# Patient Record
Sex: Female | Born: 1990 | Hispanic: Yes | Marital: Single | Smoking: Former smoker
Health system: Southern US, Community
[De-identification: ages and names within clinical notes are randomized; demographics above are authoritative.]

## PROBLEM LIST (undated history)

## (undated) ENCOUNTER — Inpatient Hospital Stay (HOSPITAL_COMMUNITY): Payer: Self-pay

## (undated) DIAGNOSIS — A749 Chlamydial infection, unspecified: Secondary | ICD-10-CM

## (undated) DIAGNOSIS — A159 Respiratory tuberculosis unspecified: Secondary | ICD-10-CM

## (undated) DIAGNOSIS — D649 Anemia, unspecified: Secondary | ICD-10-CM

## (undated) HISTORY — PX: BREAST ENHANCEMENT SURGERY: SHX7

## (undated) HISTORY — PX: LIPOSUCTION: SHX10

## (undated) HISTORY — PX: DILATION AND CURETTAGE OF UTERUS: SHX78

---

## 2011-06-02 LAB — ABO/RH: RH Type: POSITIVE

## 2011-06-02 LAB — RUBELLA ANTIBODY, IGM: Rubella: IMMUNE

## 2011-06-02 LAB — HIV ANTIBODY (ROUTINE TESTING W REFLEX): HIV: NONREACTIVE

## 2011-06-02 LAB — HEPATITIS B SURFACE ANTIGEN: Hepatitis B Surface Ag: NEGATIVE

## 2011-06-02 LAB — ANTIBODY SCREEN: Antibody Screen: NEGATIVE

## 2011-06-02 LAB — RPR: RPR: NONREACTIVE

## 2011-06-02 LAB — GC/CHLAMYDIA PROBE AMP, GENITAL: Chlamydia: NEGATIVE

## 2011-11-07 ENCOUNTER — Encounter (HOSPITAL_COMMUNITY): Payer: Self-pay | Admitting: *Deleted

## 2011-11-07 ENCOUNTER — Inpatient Hospital Stay (HOSPITAL_COMMUNITY)
Admission: AD | Admit: 2011-11-07 | Discharge: 2011-11-07 | Disposition: A | Discharge status: Home / Self Care | Payer: Self-pay | Source: Ambulatory Visit | Attending: Obstetrics & Gynecology | Admitting: Obstetrics & Gynecology

## 2011-11-07 DIAGNOSIS — O479 False labor, unspecified: Secondary | ICD-10-CM

## 2011-11-07 DIAGNOSIS — O99891 Other specified diseases and conditions complicating pregnancy: Secondary | ICD-10-CM | POA: Insufficient documentation

## 2011-11-07 HISTORY — DX: Anemia, unspecified: D64.9

## 2011-11-07 LAB — WET PREP, GENITAL
Clue Cells Wet Prep HPF POC: NONE SEEN
Trich, Wet Prep: NONE SEEN
Yeast Wet Prep HPF POC: NONE SEEN

## 2011-11-07 LAB — POCT FERN TEST: Fern Test: NEGATIVE

## 2011-11-07 NOTE — Progress Notes (Signed)
Pt G1 at 39wks, felt some wetness around 1700 and having cramping.  Pt denies bleeding.

## 2011-11-07 NOTE — ED Provider Notes (Signed)
Alicia Wang is a 20 y.o. female presenting for possible rupture of membranes and cramping. Maternal Medical History:  Reason for admission: Reason for Admission:   nausea  OB History    Grav Para Term Preterm Abortions TAB SAB Ect Mult Living   2    1  1         Past Medical History  Diagnosis Date  . Anemia    Past Surgical History  Procedure Date  . Dilation and curettage of uterus    Family History: family history is not on file. Social History:  reports that she has quit smoking. She does not have any smokeless tobacco history on file. She reports that she does not drink alcohol or use illicit drugs.  Review of Systems  Eyes: Negative for blurred vision.  Respiratory: Negative.   Cardiovascular: Negative for chest pain and palpitations.  Gastrointestinal: Negative for nausea and vomiting.  Genitourinary: Negative for dysuria.  Musculoskeletal: Negative.   Neurological: Negative.  Negative for headaches.    Dilation: 1.5 Exam by:: Hamby,CNM Blood pressure 119/69, pulse 78, temperature 98.5 F (36.9 C), temperature source Oral, resp. rate 18, height 5\' 3"  (1.6 m), weight 76.658 kg (169 lb). Maternal Exam:  Uterine Assessment: Contraction strength is mild.  Contraction duration is 60 seconds. Contraction frequency is irregular.   Abdomen: Patient reports no abdominal tenderness. Introitus: Normal vulva. Vagina is positive for vaginal discharge (THICK WHITE ODORLESS DISCHARGE.).  Pelvis: adequate for delivery.   Cervix: Cervix evaluated by sterile speculum exam and digital exam.     Fetal Exam Fetal Monitor Review: Mode: ultrasound.   Baseline rate: 150.  Variability: moderate (6-25 bpm).   Pattern: accelerations present and no decelerations.    Fetal State Assessment: Category I - tracings are normal.     Sterile spec exam: neg pooling, neg nitrazine, neg fern. Physical Exam  Genitourinary: Vaginal discharge (THICK WHITE ODORLESS DISCHARGE.) found.      Prenatal labs: ABO, Rh:   Antibody:   Rubella:   RPR:    HBsAg:    HIV:    GBS:     Assessment/Plan: Wet prep STAT. Negative for ROM. Wet prep +WBCs with bacteria, neg clue, neg yeast, neg trich. Will send urine Gc/CT, and discharge home with labor precautions.    Anice Paganini CNM 11/07/2011, 9:32 PM

## 2011-11-20 ENCOUNTER — Inpatient Hospital Stay (HOSPITAL_COMMUNITY): Payer: Medicaid Other | Admitting: Anesthesiology

## 2011-11-20 ENCOUNTER — Encounter (HOSPITAL_COMMUNITY)
Admission: AD | Disposition: A | Discharge status: Home / Self Care | Payer: Self-pay | Source: Ambulatory Visit | Attending: Obstetrics & Gynecology

## 2011-11-20 ENCOUNTER — Encounter (HOSPITAL_COMMUNITY): Payer: Self-pay | Admitting: Obstetrics and Gynecology

## 2011-11-20 ENCOUNTER — Inpatient Hospital Stay (HOSPITAL_COMMUNITY)
Admission: AD | Admit: 2011-11-20 | Discharge: 2011-11-23 | DRG: 766 | Disposition: A | Discharge status: Home / Self Care | Payer: Medicaid Other | Source: Ambulatory Visit | Attending: Obstetrics & Gynecology | Admitting: Obstetrics & Gynecology

## 2011-11-20 ENCOUNTER — Encounter (HOSPITAL_COMMUNITY): Payer: Self-pay | Admitting: Anesthesiology

## 2011-11-20 ENCOUNTER — Other Ambulatory Visit: Payer: Self-pay | Admitting: Obstetrics & Gynecology

## 2011-11-20 DIAGNOSIS — O4700 False labor before 37 completed weeks of gestation, unspecified trimester: Secondary | ICD-10-CM | POA: Diagnosis present

## 2011-11-20 DIAGNOSIS — IMO0002 Reserved for concepts with insufficient information to code with codable children: Secondary | ICD-10-CM | POA: Diagnosis present

## 2011-11-20 DIAGNOSIS — O324XX0 Maternal care for high head at term, not applicable or unspecified: Secondary | ICD-10-CM | POA: Diagnosis present

## 2011-11-20 LAB — CBC
MCH: 31.4 pg (ref 26.0–34.0)
MCHC: 33.3 g/dL (ref 30.0–36.0)
Platelets: 267 10*3/uL (ref 150–400)
RDW: 13.5 % (ref 11.5–15.5)

## 2011-11-20 LAB — RPR: RPR Ser Ql: NONREACTIVE

## 2011-11-20 SURGERY — Surgical Case
Anesthesia: Epidural | Site: Abdomen | Wound class: Clean Contaminated

## 2011-11-20 MED ORDER — MEPERIDINE HCL 25 MG/ML IJ SOLN
INTRAMUSCULAR | Status: AC
  Filled 2011-11-20: qty 1

## 2011-11-20 MED ORDER — OXYCODONE-ACETAMINOPHEN 5-325 MG PO TABS
2.0000 | ORAL_TABLET | ORAL | Status: DC | PRN
  Filled 2011-11-20: qty 1

## 2011-11-20 MED ORDER — EPHEDRINE 5 MG/ML INJ: 10.0000 mg | INTRAVENOUS | Status: DC | PRN

## 2011-11-20 MED ORDER — EPHEDRINE 5 MG/ML INJ
10.0000 mg | INTRAVENOUS | Status: DC | PRN
  Filled 2011-11-20: qty 4

## 2011-11-20 MED ORDER — LACTATED RINGERS IV SOLN
500.0000 mL | INTRAVENOUS | Status: DC | PRN
  Administered 2011-11-20: 1000 mL via INTRAVENOUS

## 2011-11-20 MED ORDER — MORPHINE SULFATE 0.5 MG/ML IJ SOLN
INTRAMUSCULAR | Status: AC
  Filled 2011-11-20: qty 10

## 2011-11-20 MED ORDER — MORPHINE SULFATE (PF) 0.5 MG/ML IJ SOLN
INTRAMUSCULAR | Status: DC | PRN
  Administered 2011-11-20: 4 mg via EPIDURAL

## 2011-11-20 MED ORDER — OXYTOCIN BOLUS FROM INFUSION
500.0000 mL | Freq: Once | INTRAVENOUS | Status: DC
  Filled 2011-11-20: qty 500

## 2011-11-20 MED ORDER — FLEET ENEMA 7-19 GM/118ML RE ENEM: 1.0000 | ENEMA | RECTAL | Status: DC | PRN

## 2011-11-20 MED ORDER — DIPHENHYDRAMINE HCL 50 MG/ML IJ SOLN: 12.5000 mg | INTRAMUSCULAR | Status: DC | PRN

## 2011-11-20 MED ORDER — ONDANSETRON HCL 4 MG/2ML IJ SOLN
INTRAMUSCULAR | Status: AC
  Filled 2011-11-20: qty 2

## 2011-11-20 MED ORDER — FENTANYL CITRATE 0.05 MG/ML IJ SOLN
INTRAMUSCULAR | Status: DC | PRN
  Administered 2011-11-20: 100 ug via INTRAVENOUS

## 2011-11-20 MED ORDER — PHENYLEPHRINE 40 MCG/ML (10ML) SYRINGE FOR IV PUSH (FOR BLOOD PRESSURE SUPPORT)
80.0000 ug | PREFILLED_SYRINGE | INTRAVENOUS | Status: DC | PRN
  Filled 2011-11-20: qty 5

## 2011-11-20 MED ORDER — LIDOCAINE HCL (PF) 1 % IJ SOLN
30.0000 mL | INTRAMUSCULAR | Status: DC | PRN
  Filled 2011-11-20 (×2): qty 30

## 2011-11-20 MED ORDER — CITRIC ACID-SODIUM CITRATE 334-500 MG/5ML PO SOLN
30.0000 mL | ORAL | Status: DC | PRN
  Administered 2011-11-20: 30 mL via ORAL
  Filled 2011-11-20: qty 15

## 2011-11-20 MED ORDER — ACETAMINOPHEN 500 MG PO TABS
1000.0000 mg | ORAL_TABLET | Freq: Once | ORAL | Status: AC
  Administered 2011-11-20: 1000 mg via ORAL
  Filled 2011-11-20: qty 2

## 2011-11-20 MED ORDER — ONDANSETRON HCL 4 MG/2ML IJ SOLN
4.0000 mg | Freq: Four times a day (QID) | INTRAMUSCULAR | Status: DC | PRN
  Administered 2011-11-20: 4 mg via INTRAVENOUS
  Filled 2011-11-20: qty 2

## 2011-11-20 MED ORDER — ONDANSETRON HCL 4 MG/2ML IJ SOLN
INTRAMUSCULAR | Status: DC | PRN
  Administered 2011-11-20: 4 mg via INTRAVENOUS

## 2011-11-20 MED ORDER — IBUPROFEN 600 MG PO TABS: 600.0000 mg | ORAL_TABLET | Freq: Four times a day (QID) | ORAL | Status: DC | PRN

## 2011-11-20 MED ORDER — ACETAMINOPHEN 325 MG PO TABS: 650.0000 mg | ORAL_TABLET | ORAL | Status: DC | PRN

## 2011-11-20 MED ORDER — OXYTOCIN 20 UNITS IN LACTATED RINGERS INFUSION - SIMPLE: 1.0000 m[IU]/min | INTRAVENOUS | Status: DC

## 2011-11-20 MED ORDER — FENTANYL 2.5 MCG/ML BUPIVACAINE 1/10 % EPIDURAL INFUSION (WH - ANES)
14.0000 mL/h | INTRAMUSCULAR | Status: DC
  Administered 2011-11-20 (×4): 14 mL/h via EPIDURAL
  Filled 2011-11-20 (×4): qty 60

## 2011-11-20 MED ORDER — CEFAZOLIN SODIUM 1-5 GM-% IV SOLN
INTRAVENOUS | Status: DC | PRN
  Administered 2011-11-20: 2 g via INTRAVENOUS

## 2011-11-20 MED ORDER — OXYTOCIN 10 UNIT/ML IJ SOLN
INTRAMUSCULAR | Status: AC
  Filled 2011-11-20: qty 4

## 2011-11-20 MED ORDER — SODIUM BICARBONATE 8.4 % IV SOLN
INTRAVENOUS | Status: DC | PRN
  Administered 2011-11-20: 10 mL via EPIDURAL

## 2011-11-20 MED ORDER — LACTATED RINGERS IV SOLN
INTRAVENOUS | Status: DC
  Administered 2011-11-20 (×2): via INTRAVENOUS

## 2011-11-20 MED ORDER — LACTATED RINGERS IV SOLN
500.0000 mL | Freq: Once | INTRAVENOUS | Status: AC
  Administered 2011-11-20: 11:00:00 via INTRAVENOUS

## 2011-11-20 MED ORDER — LIDOCAINE-EPINEPHRINE (PF) 2 %-1:200000 IJ SOLN
INTRAMUSCULAR | Status: AC
  Filled 2011-11-20: qty 20

## 2011-11-20 MED ORDER — CEFAZOLIN SODIUM-DEXTROSE 2-3 GM-% IV SOLR: 2.0000 g | INTRAVENOUS | Status: DC

## 2011-11-20 MED ORDER — MEPERIDINE HCL 25 MG/ML IJ SOLN
INTRAMUSCULAR | Status: DC | PRN
  Administered 2011-11-20: 25 mg via INTRAVENOUS

## 2011-11-20 MED ORDER — PHENYLEPHRINE 40 MCG/ML (10ML) SYRINGE FOR IV PUSH (FOR BLOOD PRESSURE SUPPORT): 80.0000 ug | PREFILLED_SYRINGE | INTRAVENOUS | Status: DC | PRN

## 2011-11-20 MED ORDER — OXYTOCIN 20 UNITS IN LACTATED RINGERS INFUSION - SIMPLE
1.0000 m[IU]/min | INTRAVENOUS | Status: DC
  Administered 2011-11-20: 2 m[IU]/min via INTRAVENOUS
  Filled 2011-11-20: qty 1000

## 2011-11-20 MED ORDER — FENTANYL CITRATE 0.05 MG/ML IJ SOLN
INTRAMUSCULAR | Status: AC
  Filled 2011-11-20: qty 2

## 2011-11-20 MED ORDER — LIDOCAINE HCL 1.5 % IJ SOLN
INTRAMUSCULAR | Status: DC | PRN
  Administered 2011-11-20 (×2): 5 mL via INTRADERMAL
  Administered 2011-11-20: 2 mL via INTRADERMAL

## 2011-11-20 MED ORDER — MORPHINE SULFATE (PF) 0.5 MG/ML IJ SOLN
INTRAMUSCULAR | Status: DC | PRN
  Administered 2011-11-20: 1 mg via INTRAVENOUS

## 2011-11-20 MED ORDER — SODIUM BICARBONATE 8.4 % IV SOLN
INTRAVENOUS | Status: AC
  Filled 2011-11-20: qty 50

## 2011-11-20 MED ORDER — OXYTOCIN 20 UNITS IN LACTATED RINGERS INFUSION - SIMPLE: 125.0000 mL/h | Freq: Once | INTRAVENOUS | Status: DC

## 2011-11-20 MED ORDER — PHENYLEPHRINE 40 MCG/ML (10ML) SYRINGE FOR IV PUSH (FOR BLOOD PRESSURE SUPPORT)
PREFILLED_SYRINGE | INTRAVENOUS | Status: AC
  Filled 2011-11-20: qty 10

## 2011-11-20 SURGICAL SUPPLY — 40 items
BENZOIN TINCTURE PRP APPL 2/3 (GAUZE/BANDAGES/DRESSINGS) ×2 IMPLANT
CANISTER WOUND CARE 500ML ATS (WOUND CARE) IMPLANT
CHLORAPREP W/TINT 26ML (MISCELLANEOUS) ×2 IMPLANT
CLOTH BEACON ORANGE TIMEOUT ST (SAFETY) ×2 IMPLANT
CONTAINER PREFILL 10% NBF 15ML (MISCELLANEOUS) IMPLANT
DRESSING TELFA 8X3 (GAUZE/BANDAGES/DRESSINGS) ×2 IMPLANT
DRSG VAC ATS LRG SENSATRAC (GAUZE/BANDAGES/DRESSINGS) IMPLANT
DRSG VAC ATS MED SENSATRAC (GAUZE/BANDAGES/DRESSINGS) IMPLANT
DRSG VAC ATS SM SENSATRAC (GAUZE/BANDAGES/DRESSINGS) IMPLANT
ELECT REM PT RETURN 9FT ADLT (ELECTROSURGICAL) ×2
ELECTRODE REM PT RTRN 9FT ADLT (ELECTROSURGICAL) ×1 IMPLANT
EXTRACTOR VACUUM M CUP 4 TUBE (SUCTIONS) IMPLANT
GAUZE SPONGE 4X4 12PLY STRL LF (GAUZE/BANDAGES/DRESSINGS) ×2 IMPLANT
GLOVE BIO SURGEON STRL SZ 6.5 (GLOVE) ×4 IMPLANT
GOWN PREVENTION PLUS LG XLONG (DISPOSABLE) ×6 IMPLANT
KIT ABG SYR 3ML LUER SLIP (SYRINGE) ×2 IMPLANT
NEEDLE HYPO 25X5/8 SAFETYGLIDE (NEEDLE) ×2 IMPLANT
NS IRRIG 1000ML POUR BTL (IV SOLUTION) ×2 IMPLANT
PACK C SECTION WH (CUSTOM PROCEDURE TRAY) ×2 IMPLANT
PAD ABD 7.5X8 STRL (GAUZE/BANDAGES/DRESSINGS) ×2 IMPLANT
RTRCTR C-SECT PINK 25CM LRG (MISCELLANEOUS) IMPLANT
RTRCTR C-SECT PINK 34CM XLRG (MISCELLANEOUS) IMPLANT
SLEEVE SCD COMPRESS KNEE MED (MISCELLANEOUS) IMPLANT
STAPLER VISISTAT 35W (STAPLE) IMPLANT
STRIP CLOSURE SKIN 1/2X4 (GAUZE/BANDAGES/DRESSINGS) ×2 IMPLANT
SUT MNCRL 0 VIOLET CTX 36 (SUTURE) ×3 IMPLANT
SUT MNCRL AB 3-0 PS2 27 (SUTURE) ×2 IMPLANT
SUT MONOCRYL 0 CTX 36 (SUTURE) ×3
SUT PDS AB 0 CTX 36 PDP370T (SUTURE) ×2 IMPLANT
SUT PLAIN 0 NONE (SUTURE) IMPLANT
SUT VIC AB 0 CT1 27 (SUTURE) ×2
SUT VIC AB 0 CT1 27XBRD ANBCTR (SUTURE) ×2 IMPLANT
SUT VIC AB 2-0 CT1 (SUTURE) IMPLANT
SUT VIC AB 2-0 CT1 27 (SUTURE) ×1
SUT VIC AB 2-0 CT1 TAPERPNT 27 (SUTURE) ×1 IMPLANT
SUT VIC AB 2-0 SH 27 (SUTURE)
SUT VIC AB 2-0 SH 27XBRD (SUTURE) IMPLANT
TOWEL OR 17X24 6PK STRL BLUE (TOWEL DISPOSABLE) ×4 IMPLANT
TRAY FOLEY CATH 14FR (SET/KITS/TRAYS/PACK) IMPLANT
WATER STERILE IRR 1000ML POUR (IV SOLUTION) ×2 IMPLANT

## 2011-11-20 NOTE — Anesthesia Procedure Notes (Signed)

## 2011-11-20 NOTE — Progress Notes (Signed)
Hospital spanish interpretater in. Epidural procedure explained to pt.

## 2011-11-20 NOTE — Progress Notes (Signed)
Patient states she is having contractions about every 5 minutes. Feeling some movement, not as much as usual.

## 2011-11-20 NOTE — Progress Notes (Signed)
1530- 02 dc'd.

## 2011-11-20 NOTE — Anesthesia Preprocedure Evaluation (Addendum)
Anesthesia Evaluation  Patient identified by MRN, date of birth, ID band Patient awake    Reviewed: Allergy & Precautions, H&P , NPO status , Patient's Chart, lab work & pertinent test results, reviewed documented beta blocker date and time   History of Anesthesia Complications Negative for: history of anesthetic complications  Airway Mallampati: II TM Distance: >3 FB Neck ROM: full    Dental  (+) Teeth Intact   Pulmonary neg pulmonary ROS,  clear to auscultation        Cardiovascular neg cardio ROS regular Normal    Neuro/Psych Negative Neurological ROS  Negative Psych ROS   GI/Hepatic negative GI ROS, Neg liver ROS,   Endo/Other  Negative Endocrine ROS  Renal/GU negative Renal ROS     Musculoskeletal   Abdominal   Peds  Hematology negative hematology ROS (+)   Anesthesia Other Findings   Reproductive/Obstetrics (+) Pregnancy                           Anesthesia Physical Anesthesia Plan  ASA: II  Anesthesia Plan: Epidural   Post-op Pain Management:    Induction:   Airway Management Planned:   Additional Equipment:   Intra-op Plan:   Post-operative Plan:   Informed Consent: I have reviewed the patients History and Physical, chart, labs and discussed the procedure including the risks, benefits and alternatives for the proposed anesthesia with the patient or authorized representative who has indicated his/her understanding and acceptance.     Plan Discussed with:   Anesthesia Plan Comments:         Anesthesia Quick Evaluation  

## 2011-11-20 NOTE — H&P (Signed)
Demara Lover is a 20 y.o. female presenting for contractions. Maternal Medical History:  Reason for admission: Reason for admission: contractions.  Reason for Admission:   nauseaContractions: Frequency: regular.    Fetal activity: Perceived fetal activity is normal.    Prenatal complications: no prenatal complications   OB History    Grav Para Term Preterm Abortions TAB SAB Ect Mult Living   2 0 0 0 1 0 1 0 0 0      Past Medical History  Diagnosis Date  . Anemia    Past Surgical History  Procedure Date  . Dilation and curettage of uterus    Family History: family history is not on file. Social History:  reports that she has quit smoking. She does not have any smokeless tobacco history on file. She reports that she does not drink alcohol or use illicit drugs.  Review of Systems  Constitutional: Negative for fever.  Eyes: Negative for blurred vision.  Respiratory: Negative for shortness of breath.   Gastrointestinal: Negative for nausea and vomiting.  Skin: Negative for rash.  Neurological: Negative for headaches.    Dilation: 4 Effacement (%): 80 Station: -2 Exam by:: J. Rasch RN  Blood pressure 108/84, pulse 86, temperature 98.5 F (36.9 C), temperature source Oral, resp. rate 18, height 5\' 3"  (1.6 m), weight 77.202 kg (170 lb 3.2 oz), SpO2 99.00%. Maternal Exam:  Uterine Assessment: Contraction strength is firm.  Contraction frequency is regular.   Abdomen: Patient reports no abdominal tenderness. Fetal presentation: vertex  Introitus: not evaluated.     Fetal Exam Fetal Monitor Review: Variability: moderate (6-25 bpm).   Pattern: accelerations present and no decelerations.    Fetal State Assessment: Category I - tracings are normal.     Physical Exam  Constitutional: She appears well-developed.  HENT:  Head: Normocephalic.  Neck: Neck supple. No thyromegaly present.  Cardiovascular: Normal rate and regular rhythm.   Respiratory: Breath sounds  normal.  GI: Soft. Bowel sounds are normal.  Skin: No rash noted.    Prenatal labs: ABO, Rh: O/Positive/-- (06/13 0000) Antibody: Negative (06/13 0000) Rubella: Immune (06/13 0000) RPR: Nonreactive (06/13 0000)  HBsAg: Negative (06/13 0000)  HIV: Non-reactive (06/13 0000)  GBS: Negative (11/08 0000)   Assessment/Plan: Nullipara at term, active labor, Category 1 FHT. Admit, anticipate an NSVD   JACKSON-MOORE,Majestic Brister A 11/20/2011, 9:52 AM

## 2011-11-20 NOTE — Progress Notes (Signed)
Alicia Wang is a 20 y.o. G2P0010 at [redacted]w[redacted]d by LMP admitted for active labor  Subjective: Comfortable  Objective: BP 112/68  Pulse 64  Temp(Src) 98.5 F (36.9 C) (Oral)  Resp 20  Ht 5\' 3"  (1.6 m)  Wt 77.202 kg (170 lb 3.2 oz)  BMI 30.15 kg/m2  SpO2 100%      FHT: 140s, moderate variability, early decelerations UC:   irregular, every 8 minutes SVE:   Dilation: 6 Effacement (%): 100 Station: -1 Exam by:: dr. Tamela Oddi IUPC placed  Labs: Lab Results  Component Value Date   WBC 10.5 11/20/2011   HGB 13.3 11/20/2011   HCT 39.9 11/20/2011   MCV 94.3 11/20/2011   PLT 267 11/20/2011    Assessment / Plan: Protracted latent phase  Labor: Will augment with Pitocin  Fetal Wellbeing:  Category I Pain Control:  Epidural I/D:  n/a Anticipated MOD:  NSVD  JACKSON-MOORE,Pope Brunty A 11/20/2011, 2:42 PM

## 2011-11-20 NOTE — Progress Notes (Signed)
Called Dr. Tamela Oddi to report  Decels and temp. Of 100.3.  Provider states she is looking at strip and it is fine, orders received for Tylenol.

## 2011-11-20 NOTE — Progress Notes (Signed)
Pt presents to [facility] with chief complaint of contractions. Pt says contractions started this morning at 0400. Pt is a G1 at [redacted]w[redacted]d

## 2011-11-20 NOTE — Progress Notes (Signed)
Alicia Wang is a 20 y.o. G2P0010 at [redacted]w[redacted]d by LMP admitted for active labor  Subjective: Comfortable  Objective: BP 118/67  Pulse 71  Temp(Src) 98.6 F (37 C) (Oral)  Resp 20  Ht 5\' 3"  (1.6 m)  Wt 77.202 kg (170 lb 3.2 oz)  BMI 30.15 kg/m2  SpO2 99%      FHT:  FHR: 140 bpm, variability: moderate,  accelerations:  Abscent,  decelerations:  Present mild variable decelerations UC:   regular, every 2 minutes SVE:   Dilation: 10 Effacement (%): 100 Station: +1 Exam by:: B.Cagna,Rn  Labs: Lab Results  Component Value Date   WBC 10.5 11/20/2011   HGB 13.3 11/20/2011   HCT 39.9 11/20/2011   MCV 94.3 11/20/2011   PLT 267 11/20/2011    Assessment / Plan: Stage II  Labor:  Passive second stage for now   Fetal Wellbeing:  FHT c/w fetal well-being Pain Control:  Epidural I/D:  n/a Anticipated MOD:  NSVD  JACKSON-MOORE,Sophiarose Eades A 11/20/2011, 10:22 PM

## 2011-11-21 ENCOUNTER — Encounter (HOSPITAL_COMMUNITY): Payer: Self-pay | Admitting: *Deleted

## 2011-11-21 LAB — HEMOGLOBIN: Hemoglobin: 11 g/dL — ABNORMAL LOW (ref 12.0–15.0)

## 2011-11-21 MED ORDER — TETANUS-DIPHTH-ACELL PERTUSSIS 5-2.5-18.5 LF-MCG/0.5 IM SUSP: 0.5000 mL | Freq: Once | INTRAMUSCULAR | Status: DC

## 2011-11-21 MED ORDER — WITCH HAZEL-GLYCERIN EX PADS: 1.0000 "application " | MEDICATED_PAD | CUTANEOUS | Status: DC | PRN

## 2011-11-21 MED ORDER — MEPERIDINE HCL 25 MG/ML IJ SOLN: 6.2500 mg | INTRAMUSCULAR | Status: DC | PRN

## 2011-11-21 MED ORDER — KETOROLAC TROMETHAMINE 60 MG/2ML IM SOLN
60.0000 mg | Freq: Once | INTRAMUSCULAR | Status: AC | PRN
  Administered 2011-11-21: 60 mg via INTRAMUSCULAR

## 2011-11-21 MED ORDER — ONDANSETRON HCL 4 MG/2ML IJ SOLN: 4.0000 mg | Freq: Three times a day (TID) | INTRAMUSCULAR | Status: DC | PRN

## 2011-11-21 MED ORDER — IBUPROFEN 600 MG PO TABS
600.0000 mg | ORAL_TABLET | Freq: Four times a day (QID) | ORAL | Status: DC | PRN
  Filled 2011-11-21 (×5): qty 1

## 2011-11-21 MED ORDER — SODIUM CHLORIDE 0.9 % IV SOLN
1.0000 ug/kg/h | INTRAVENOUS | Status: DC | PRN
  Filled 2011-11-21: qty 2.5

## 2011-11-21 MED ORDER — IBUPROFEN 600 MG PO TABS
600.0000 mg | ORAL_TABLET | Freq: Four times a day (QID) | ORAL | Status: DC
  Administered 2011-11-21 – 2011-11-23 (×8): 600 mg via ORAL
  Filled 2011-11-21 (×3): qty 1

## 2011-11-21 MED ORDER — SODIUM CHLORIDE 0.9 % IJ SOLN: 3.0000 mL | INTRAMUSCULAR | Status: DC | PRN

## 2011-11-21 MED ORDER — METOCLOPRAMIDE HCL 5 MG/ML IJ SOLN: 10.0000 mg | Freq: Three times a day (TID) | INTRAMUSCULAR | Status: DC | PRN

## 2011-11-21 MED ORDER — FERROUS SULFATE 325 (65 FE) MG PO TABS
325.0000 mg | ORAL_TABLET | Freq: Two times a day (BID) | ORAL | Status: DC
  Administered 2011-11-21 – 2011-11-23 (×4): 325 mg via ORAL
  Filled 2011-11-21 (×4): qty 1

## 2011-11-21 MED ORDER — LACTATED RINGERS IV SOLN: INTRAVENOUS | Status: DC

## 2011-11-21 MED ORDER — NALBUPHINE SYRINGE 5 MG/0.5 ML
5.0000 mg | INJECTION | INTRAMUSCULAR | Status: DC | PRN
  Filled 2011-11-21: qty 1

## 2011-11-21 MED ORDER — MEASLES, MUMPS & RUBELLA VAC ~~LOC~~ INJ
0.5000 mL | INJECTION | Freq: Once | SUBCUTANEOUS | Status: DC
  Filled 2011-11-21: qty 0.5

## 2011-11-21 MED ORDER — PHENYLEPHRINE HCL 10 MG/ML IJ SOLN
INTRAMUSCULAR | Status: DC | PRN
  Administered 2011-11-21: 80 ug via INTRAVENOUS
  Administered 2011-11-21: 40 ug via INTRAVENOUS
  Administered 2011-11-21: 80 ug via INTRAVENOUS

## 2011-11-21 MED ORDER — ONDANSETRON HCL 4 MG/2ML IJ SOLN: 4.0000 mg | INTRAMUSCULAR | Status: DC | PRN

## 2011-11-21 MED ORDER — MAGNESIUM HYDROXIDE 400 MG/5ML PO SUSP: 30.0000 mL | ORAL | Status: DC | PRN

## 2011-11-21 MED ORDER — DIPHENHYDRAMINE HCL 25 MG PO CAPS: 25.0000 mg | ORAL_CAPSULE | Freq: Four times a day (QID) | ORAL | Status: DC | PRN

## 2011-11-21 MED ORDER — SIMETHICONE 80 MG PO CHEW
80.0000 mg | CHEWABLE_TABLET | ORAL | Status: DC | PRN
  Administered 2011-11-21 – 2011-11-23 (×8): 80 mg via ORAL

## 2011-11-21 MED ORDER — INFLUENZA VIRUS VACC SPLIT PF IM SUSP: 0.5000 mL | INTRAMUSCULAR | Status: DC | PRN

## 2011-11-21 MED ORDER — HYDROMORPHONE HCL PF 1 MG/ML IJ SOLN
1.0000 mg | Freq: Once | INTRAMUSCULAR | Status: AC
  Administered 2011-11-21: 0.5 mg via INTRAVENOUS
  Filled 2011-11-21: qty 1

## 2011-11-21 MED ORDER — KETOROLAC TROMETHAMINE 60 MG/2ML IM SOLN
INTRAMUSCULAR | Status: AC
  Administered 2011-11-21: 60 mg via INTRAMUSCULAR
  Filled 2011-11-21: qty 2

## 2011-11-21 MED ORDER — DIPHENHYDRAMINE HCL 50 MG/ML IJ SOLN: 12.5000 mg | INTRAMUSCULAR | Status: DC | PRN

## 2011-11-21 MED ORDER — OXYCODONE-ACETAMINOPHEN 5-325 MG PO TABS
1.0000 | ORAL_TABLET | ORAL | Status: DC | PRN
  Administered 2011-11-21 – 2011-11-23 (×4): 1 via ORAL
  Filled 2011-11-21 (×4): qty 1

## 2011-11-21 MED ORDER — LACTATED RINGERS IV SOLN
INTRAVENOUS | Status: DC | PRN
  Administered 2011-11-20 – 2011-11-21 (×2): via INTRAVENOUS

## 2011-11-21 MED ORDER — SENNOSIDES-DOCUSATE SODIUM 8.6-50 MG PO TABS
2.0000 | ORAL_TABLET | Freq: Every day | ORAL | Status: DC
  Administered 2011-11-21 – 2011-11-22 (×2): 2 via ORAL

## 2011-11-21 MED ORDER — KETOROLAC TROMETHAMINE 30 MG/ML IJ SOLN: 30.0000 mg | Freq: Four times a day (QID) | INTRAMUSCULAR | Status: AC | PRN

## 2011-11-21 MED ORDER — ONDANSETRON HCL 4 MG PO TABS: 4.0000 mg | ORAL_TABLET | ORAL | Status: DC | PRN

## 2011-11-21 MED ORDER — PRENATAL PLUS 27-1 MG PO TABS
1.0000 | ORAL_TABLET | Freq: Every day | ORAL | Status: DC
  Administered 2011-11-21 – 2011-11-23 (×3): 1 via ORAL
  Filled 2011-11-21 (×3): qty 1

## 2011-11-21 MED ORDER — SODIUM CHLORIDE 0.9 % IR SOLN
Status: DC | PRN
  Administered 2011-11-21 (×2): 1

## 2011-11-21 MED ORDER — MEDROXYPROGESTERONE ACETATE 150 MG/ML IM SUSP: 150.0000 mg | INTRAMUSCULAR | Status: DC | PRN

## 2011-11-21 MED ORDER — SCOPOLAMINE 1 MG/3DAYS TD PT72
1.0000 | MEDICATED_PATCH | Freq: Once | TRANSDERMAL | Status: DC
  Filled 2011-11-21: qty 1

## 2011-11-21 MED ORDER — OXYTOCIN 20 UNITS IN LACTATED RINGERS INFUSION - SIMPLE: 125.0000 mL/h | INTRAVENOUS | Status: AC

## 2011-11-21 MED ORDER — DIPHENHYDRAMINE HCL 50 MG/ML IJ SOLN: 25.0000 mg | INTRAMUSCULAR | Status: DC | PRN

## 2011-11-21 MED ORDER — OXYTOCIN 10 UNIT/ML IJ SOLN
20.0000 [IU] | INTRAVENOUS | Status: DC | PRN
  Administered 2011-11-20: 20 [IU] via INTRAVENOUS

## 2011-11-21 MED ORDER — NALOXONE HCL 0.4 MG/ML IJ SOLN: 0.4000 mg | INTRAMUSCULAR | Status: DC | PRN

## 2011-11-21 MED ORDER — DIPHENHYDRAMINE HCL 25 MG PO CAPS: 25.0000 mg | ORAL_CAPSULE | ORAL | Status: DC | PRN

## 2011-11-21 MED ORDER — ZOLPIDEM TARTRATE 5 MG PO TABS: 5.0000 mg | ORAL_TABLET | Freq: Every evening | ORAL | Status: DC | PRN

## 2011-11-21 MED ORDER — HYDROMORPHONE HCL 2 MG PO TABS: 1.0000 mg | ORAL_TABLET | Freq: Once | ORAL | Status: DC

## 2011-11-21 MED ORDER — LANOLIN HYDROUS EX OINT: 1.0000 "application " | TOPICAL_OINTMENT | CUTANEOUS | Status: DC | PRN

## 2011-11-21 MED ORDER — DIBUCAINE 1 % RE OINT: 1.0000 "application " | TOPICAL_OINTMENT | RECTAL | Status: DC | PRN

## 2011-11-21 MED ORDER — VARICELLA VIRUS VACCINE LIVE 1350 PFU/0.5ML IJ SUSR
0.5000 mL | INTRAMUSCULAR | Status: DC | PRN
Start: 2011-11-21 — End: 2011-11-23

## 2011-11-21 NOTE — OR Nursing (Signed)
Fundal massage by  DLWegner RN 

## 2011-11-21 NOTE — Anesthesia Postprocedure Evaluation (Signed)
  Anesthesia Post-op Note  Patient: Alicia Wang  Procedure(s) Performed:  CESAREAN SECTION - primary cesarean section of baby girl  at  2349  APGAR 9/9  Patient Location: Mother/Baby  Anesthesia Type: Epidural  Level of Consciousness: awake, alert  and oriented  Airway and Oxygen Therapy: Patient Spontanous Breathing and Patient connected to nasal cannula oxygen SaO2 this morning 97%.  Preparing to ambulate with nurse.  Post-op Assessment: Patient's Cardiovascular Status Stable and Respiratory Function Stable  Post-op Vital Signs: Reviewed and stable  Complications: No apparent anesthesia complications

## 2011-11-21 NOTE — Progress Notes (Signed)
c-section called for arrest of descent.

## 2011-11-21 NOTE — Transfer of Care (Signed)
Immediate Anesthesia Transfer of Care Note  Patient: Alicia Wang  Procedure(s) Performed:  CESAREAN SECTION - primary cesarean section of baby girl  at  2349  APGAR 9/9  Patient Location: PACU  Anesthesia Type: Epidural  Level of Consciousness: awake, alert  and oriented  Airway & Oxygen Therapy: Patient Spontanous Breathing  Post-op Assessment: Report given to PACU RN and Post -op Vital signs reviewed and stable  Post vital signs: stable  Complications: No apparent anesthesia complications

## 2011-11-21 NOTE — Progress Notes (Signed)
Epidural cathter dc blue tip intact. Pt tolerated well.

## 2011-11-21 NOTE — Op Note (Signed)
Cesarean Section Procedure Note   Alicia Wang   11/20/2011 - 11/21/2011  Indications: Arrest of descent   Pre-operative Diagnosis: arrest of descent.   Post-operative Diagnosis: Same   Surgeon: Antionette Char A  Assistants: none  Anesthesia: epidural  Procedure Details:  The patient was seen in the Holding Room. The risks, benefits, complications, treatment options, and expected outcomes were discussed with the patient. The patient concurred with the proposed plan, giving informed consent. The patient was identified as Alicia Wang and the procedure verified as C-Section Delivery. A Time Out was held and the above information confirmed.  After induction of anesthesia, the patient was draped and prepped in the usual sterile manner. A transverse incision was made and carried down through the subcutaneous tissue to the fascia. The fascial incision was made and extended transversely. The fascia was separated from the underlying rectus tissue superiorly and inferiorly. The peritoneum was identified and entered. The peritoneal incision was extended longitudinally. The utero-vesical peritoneal reflection was incised transversely.  A low transverse uterine incision was made. Delivered from cephalic presentation was a  living newborn female infant with Apgar scores of 9 at one minute and 9 at five minutes. A cord ph was not sent. The umbilical cord was clamped and cut cord. A sample was obtained for evaluation. The placenta was removed Intact and appeared normal.  The uterine incision was closed with running locked sutures of 1-0 Monocryl. A second imbricating layer of the same suture was placed.  Hemostasis was observed. The paracolic gutters were irrigated. The fascia was then reapproximated with running sutures of 1-0Vicryl. The subcuticular closure was performed using 3-0 Monocryl.  Instrument, sponge, and needle counts were correct prior the abdominal closure and were correct at  the conclusion of the case.    Findings:  See above   Estimated Blood Loss: 800 ml  Total IV Fluids: 1,015ml   Urine Output: 100CC OF bloody urine  Specimens: Placenta   Complications: no complications  Disposition: PACU - hemodynamically stable.  Maternal Condition: stable   Baby condition / location:  nursery-stable    Signed: Surgeon(s): [name], MD

## 2011-11-21 NOTE — Addendum Note (Signed)
Addendum  created 11/21/11 1128 by Edison Pace, CRNA   Modules edited:Notes Section

## 2011-11-21 NOTE — Progress Notes (Signed)
Patient ID: Alicia Wang, female   DOB: [birth year], 20 y.o.   MRN: [id number] Subjective: POD# 1 s/p Cesarean Delivery.  Indications: failure to progress  RH status/Rubella reviewed. Feeding: breast Patient reports tolerating PO.  Denies HA/SOB/C/P/N/V/dizziness.  Reports flatus or BM. Breast symptoms: no.  She reports vaginal bleeding as normal, without clots.  She is ambulating, urinating without difficulty.     Objective: Vital signs in last 24 hours: BP 114/55  Pulse 88  Temp(Src) 98.8 F (37.1 C) (Oral)  Resp 18  Ht 5\' 3"  (1.6 m)  Wt 77.202 kg (170 lb 3.2 oz)  BMI 30.15 kg/m2  SpO2 97%  Breastfeeding? Unknown       Physical Exam:  General: alert CV: Regular rate and rhythm Resp: clear Abdomen: soft, nontender, normal bowel sounds Lochia: minimal Uterine Fundus: firm, below umbilicus, tender Incision: dressing C/D Ext: extremities normal, atraumatic, no cyanosis or edema    Basename 11/21/11 0608 11/20/11 0859  HGB 11.0* 13.3  HCT -- 39.9      Assessment/Plan: 20 y.o.  status post Cesarean section. POD# 1.   Doing well, stable.              Advance diet as tolerated Start po pain meds D/C foley  HLIV  Ambulate IS Routine post-op care  JACKSON-MOORE,[name] 11/21/2011, 12:12 PM

## 2011-11-21 NOTE — Anesthesia Postprocedure Evaluation (Signed)
Anesthesia Post Note  Patient: Alicia Wang  Procedure(s) Performed:  CESAREAN SECTION - primary cesarean section of baby girl  at  2349  APGAR 9/9  Anesthesia type: Epidural  Patient location: PACU  Post pain: Pain level controlled  Post assessment: Post-op Vital signs reviewed  Last Vitals:  Filed Vitals:   11/21/11 0100  BP: 105/49  Pulse: 76  Temp:   Resp: 20    Post vital signs: stable  Level of consciousness: awake  Complications: No apparent anesthesia complications

## 2011-11-22 NOTE — Progress Notes (Signed)
Patient ID: Alicia Wang, female   DOB: [birth year], 20 y.o.   MRN: [id number] Subjective: POD# 2 s/p Cesarean Delivery.  Indications: failure to progress  RH status/Rubella reviewed. Feeding: breast Patient reports tolerating PO.  Denies HA/SOB/C/P/N/V/dizziness.  Reports flatus or BM. Breast symptoms: no.  She reports vaginal bleeding as normal, without clots.  She is ambulating, urinating without difficulty.     Objective: Vital signs in last 24 hours: BP 110/61  Pulse 84  Temp(Src) 97.4 F (36.3 C) (Oral)  Resp 18  Ht 5\' 3"  (1.6 m)  Wt 77.202 kg (170 lb 3.2 oz)  BMI 30.15 kg/m2  SpO2 96%  Breastfeeding? Unknown       Physical Exam:  General: alert CV: Regular rate and rhythm Resp: clear Abdomen: soft, nontender, normal bowel sounds Lochia: minimal Uterine Fundus: firm, below umbilicus, nontender Incision: clean, dry and intact Ext: edema 1+    Basename 11/21/11 0608 11/20/11 0859  HGB 11.0* 13.3  HCT -- 39.9      Assessment/Plan: 20 y.o.  status post Cesarean section. POD# 2.   Doing well, stable.               Ambulate IS Routine post-op care  JACKSON-MOORE,Santiel Topper A 11/22/2011, 8:17 AM

## 2011-11-22 NOTE — Progress Notes (Signed)
UR chart review completed.  

## 2011-11-22 NOTE — Progress Notes (Signed)
[facility] Department of Clinical Social Work Documentation of Interpretation   I assisted ___Lisa_______________ with interpretation of ___plan of care___________________ for this patient.

## 2011-11-23 ENCOUNTER — Encounter (HOSPITAL_COMMUNITY): Payer: Self-pay | Admitting: Obstetrics & Gynecology

## 2011-11-23 MED ORDER — IBUPROFEN 600 MG PO TABS: 600.0000 mg | ORAL_TABLET | Freq: Four times a day (QID) | ORAL | Status: AC | PRN

## 2011-11-23 MED ORDER — OXYCODONE-ACETAMINOPHEN 5-325 MG PO TABS
1.0000 | ORAL_TABLET | Freq: Four times a day (QID) | ORAL | Status: AC | PRN
Start: 2011-11-23 — End: 2011-12-03

## 2011-11-23 MED ORDER — NORETHINDRONE 0.35 MG PO TABS: 1.0000 | ORAL_TABLET | Freq: Every day | ORAL | Status: DC

## 2011-11-23 NOTE — Progress Notes (Signed)
Subjective: POD #3 s/p LTC/S  Indication:failure to progress Patient reports tolerating PO.  Denies HA/SOB/C/P/N/V/dizziness.  Reports flatus or BM. Breast symptoms: no  She reports vaginal bleeding as normal, without clots.  She is ambulating, urinating without difficulty.     Objective: Vital signs in last 24 hours: BP 103/66  Pulse 82  Temp(Src) 98.3 F (36.8 C) (Oral)  Resp 20  Ht 5\' 3"  (1.6 m)  Wt 77.202 kg (170 lb 3.2 oz)  BMI 30.15 kg/m2  SpO2 98%  Breastfeeding? Unknown  Physical Exam:  General: alert CV: Regular rate and rhythm Resp: clear Abdomen: soft, nontender, normal bowel sounds Lochia: minimal Uterine Fundus: firm, below umbilicus, nontender Incision: clean, dry and intact Ext: extremities normal, atraumatic, no cyanosis or edema  Basename 11/21/11 0608 11/20/11 0859  HGB 11.0* 13.3  HCT -- 39.9    Assessment/Plan: 20 y.o. status post Cesarean section POD# 3.  normal post-operative exam patient is a candidate for oral progesterone-only contraceptive for contraception, with no contraindications  Routine post-op care D/C home  JACKSON-MOORE,Chantil Bari A 11/23/2011, 8:03 AM

## 2011-11-23 NOTE — Progress Notes (Signed)
[facility] Department of Clinical Social Work Documentation of Interpretation   I assisted ___________________ with interpretation of _Stopped by to see if patient had any needs_____________________ for this patient.

## 2011-11-23 NOTE — Discharge Summary (Signed)
Obstetric Discharge Summary Reason for Admission: onset of labor Prenatal Procedures: none Intrapartum Procedures: cesarean: low cervical, transverse Postpartum Procedures: none Complications-Operative and Postpartum: none Hemoglobin  Date Value Range Status  11/21/2011 11.0* 12.0-15.0 (g/dL) Final     REPEATED TO VERIFY     DELTA CHECK NOTED     HCT  Date Value Range Status  11/20/2011 39.9  36.0-46.0 (%) Final    Discharge Diagnoses: Term Pregnancy-delivered  Discharge Information: Date: 11/23/2011 Activity: pelvic rest Diet: routine Medications: PNV, Ibuprofen, Iron, Percocet and Micronor Condition: stable Instructions: see above Discharge to: home Follow-up Information    Follow up with [name], MD. Make an appointment in 2 weeks.   Contact information:   [address], [address] [place] [address] [phone]          Newborn Data: Live born female  Birth Weight: 8 lb 2 oz (3685 g) APGAR: 9, 9  Home with mother.  Alicia Wang,Alicia Wang 11/23/2011, 8:07 AM

## 2014-03-15 ENCOUNTER — Ambulatory Visit: Payer: Self-pay | Admitting: Physician Assistant

## 2014-03-15 VITALS — BP 102/68 | HR 79 | Temp 98.3°F | Resp 18 | Ht 61.0 in | Wt 100.0 lb

## 2014-03-15 DIAGNOSIS — N912 Amenorrhea, unspecified: Secondary | ICD-10-CM

## 2014-03-15 DIAGNOSIS — Z331 Pregnant state, incidental: Secondary | ICD-10-CM

## 2014-03-15 DIAGNOSIS — Z3490 Encounter for supervision of normal pregnancy, unspecified, unspecified trimester: Secondary | ICD-10-CM

## 2014-03-15 LAB — POCT URINE PREGNANCY: Preg Test, Ur: POSITIVE

## 2014-03-15 MED ORDER — PRENATAL MULTIVITAMIN CH: 1.0000 | ORAL_TABLET | Freq: Every day | ORAL | Status: DC

## 2014-03-15 NOTE — Progress Notes (Signed)
   Subjective:    Patient ID: Alicia Wang, female    DOB: 08/28/1991, 23 y.o.   MRN: [id number]  HPI Pt presents to clinic with concerns regarding nausea. The nausea started about the same time as she started the flagyl that she was given 2 days ago when she went to the [facility] for vaginal discharge and was diagnosed with BV.  She realized that she had not had a period since 1/30 and wondered if she might be pregnant so she is here to check a pregnancy test.  Her breast are tender more than normal and slightly better.  She is not taking a PNV.  She does not drink ETOH or use drugs or smoke.  Review of Systems  Genitourinary: Positive for vaginal discharge. Negative for dysuria and vaginal bleeding.       Objective:   Physical Exam  Vitals reviewed. Constitutional: She is oriented to person, place, and time. She appears well-developed and well-nourished.  HENT:  Head: Normocephalic and atraumatic.  Right Ear: External ear normal.  Left Ear: External ear normal.  Pulmonary/Chest: Effort normal.  Neurological: She is alert and oriented to person, place, and time.  Skin: Skin is warm and dry.  Psychiatric: She has a normal mood and affect. Her behavior is normal. Judgment and thought content normal.    Results for orders placed in visit on 03/15/14  POCT URINE PREGNANCY      Result Value Ref Range   Preg Test, Ur Positive         Assessment & Plan:  Absent menses - Plan: POCT urine pregnancy  Pregnancy - Plan: Prenatal Vit-Fe Fumarate-FA (PRENATAL MULTIVITAMIN) TABS tablet  [facility].  It is not contraindicated in 1st trimester but only given if pt cannot tolerate the symptoms of the BV.  The patient thinks that she had STD testing at the HD but has not heard the results.  She will start the PNV and not restart the flagyl due to the nausea that possibly is coming from that.  She will call the health dept. For a  prenatal visit.  [facility] develops vaginal bleeding or cramping.  Answered her questions and she voiced understanding.  The visit was interpreted by a staff CMA.   Benny LennertSarah Weber PA-C  [facility] 03/15/2014 5:31 PM

## 2014-03-15 NOTE — Patient Instructions (Addendum)
7 weeks 6 days Due 10/25/2014  Call health department for prenatal visit

## 2016-07-31 ENCOUNTER — Emergency Department (HOSPITAL_COMMUNITY)
Admission: EM | Admit: 2016-07-31 | Discharge: 2016-08-01 | Disposition: A | Discharge status: Home / Self Care | Payer: Medicaid Other | Attending: Emergency Medicine | Admitting: Emergency Medicine

## 2016-07-31 ENCOUNTER — Encounter (HOSPITAL_COMMUNITY): Payer: Self-pay

## 2016-07-31 DIAGNOSIS — R1032 Left lower quadrant pain: Secondary | ICD-10-CM

## 2016-07-31 DIAGNOSIS — N898 Other specified noninflammatory disorders of vagina: Secondary | ICD-10-CM | POA: Diagnosis not present

## 2016-07-31 DIAGNOSIS — Z87891 Personal history of nicotine dependence: Secondary | ICD-10-CM | POA: Diagnosis not present

## 2016-07-31 DIAGNOSIS — Z91040 Latex allergy status: Secondary | ICD-10-CM | POA: Diagnosis not present

## 2016-07-31 DIAGNOSIS — O23591 Infection of other part of genital tract in pregnancy, first trimester: Secondary | ICD-10-CM | POA: Insufficient documentation

## 2016-07-31 DIAGNOSIS — O26891 Other specified pregnancy related conditions, first trimester: Secondary | ICD-10-CM | POA: Diagnosis present

## 2016-07-31 DIAGNOSIS — Z3490 Encounter for supervision of normal pregnancy, unspecified, unspecified trimester: Secondary | ICD-10-CM

## 2016-07-31 DIAGNOSIS — Z79899 Other long term (current) drug therapy: Secondary | ICD-10-CM | POA: Diagnosis not present

## 2016-07-31 DIAGNOSIS — N760 Acute vaginitis: Secondary | ICD-10-CM

## 2016-07-31 DIAGNOSIS — Z3A01 Less than 8 weeks gestation of pregnancy: Secondary | ICD-10-CM | POA: Insufficient documentation

## 2016-07-31 NOTE — ED Triage Notes (Signed)
Pt LMP was in June. She states that she has had positive home pregnancy tests. She presents today c/o LLQ pain. She states that it feels like cramping. Pt also states that her urine has a foul smell. She reports no difficulty urinating, however. A&Ox4.

## 2016-08-01 ENCOUNTER — Emergency Department (HOSPITAL_COMMUNITY): Payer: Medicaid Other

## 2016-08-01 LAB — COMPREHENSIVE METABOLIC PANEL
ALBUMIN: 4.2 g/dL (ref 3.5–5.0)
ALT: 13 U/L — ABNORMAL LOW (ref 14–54)
ANION GAP: 5 (ref 5–15)
AST: 15 U/L (ref 15–41)
Alkaline Phosphatase: 36 U/L — ABNORMAL LOW (ref 38–126)
BUN: 7 mg/dL (ref 6–20)
CO2: 25 mmol/L (ref 22–32)
Calcium: 9.3 mg/dL (ref 8.9–10.3)
Chloride: 105 mmol/L (ref 101–111)
Creatinine, Ser: 0.61 mg/dL (ref 0.44–1.00)
GFR calc non Af Amer: >60 mL/min (ref >60)
GLUCOSE: 92 mg/dL (ref 65–99)
POTASSIUM: 3.6 mmol/L (ref 3.5–5.1)
SODIUM: 135 mmol/L (ref 135–145)
Total Bilirubin: 0.8 mg/dL (ref 0.3–1.2)
Total Protein: 7.3 g/dL (ref 6.5–8.1)

## 2016-08-01 LAB — CBC
HEMATOCRIT: 36.1 % (ref 36.0–46.0)
HEMOGLOBIN: 12.2 g/dL (ref 12.0–15.0)
MCH: 30.6 pg (ref 26.0–34.0)
MCHC: 33.8 g/dL (ref 30.0–36.0)
MCV: 90.5 fL (ref 78.0–100.0)
Platelets: 295 10*3/uL (ref 150–400)
RBC: 3.99 MIL/uL (ref 3.87–5.11)
RDW: 12.3 % (ref 11.5–15.5)
WBC: 8.8 10*3/uL (ref 4.0–10.5)

## 2016-08-01 LAB — URINALYSIS, ROUTINE W REFLEX MICROSCOPIC
Bilirubin Urine: NEGATIVE
Glucose, UA: NEGATIVE mg/dL
Hgb urine dipstick: NEGATIVE
KETONES UR: NEGATIVE mg/dL
LEUKOCYTES UA: NEGATIVE
NITRITE: NEGATIVE
PH: 6 (ref 5.0–8.0)
PROTEIN: NEGATIVE mg/dL
Specific Gravity, Urine: 1.013 (ref 1.005–1.030)

## 2016-08-01 LAB — I-STAT BETA HCG BLOOD, ED (MC, WL, AP ONLY): I-stat hCG, quantitative: >2000 m[IU]/mL — ABNORMAL HIGH (ref <5)

## 2016-08-01 LAB — WET PREP, GENITAL
SPERM: NONE SEEN
Trich, Wet Prep: NONE SEEN
YEAST WET PREP: NONE SEEN

## 2016-08-01 LAB — HIV ANTIBODY (ROUTINE TESTING W REFLEX): HIV Screen 4th Generation wRfx: NONREACTIVE

## 2016-08-01 LAB — LIPASE, BLOOD: LIPASE: 20 U/L (ref 11–51)

## 2016-08-01 LAB — HCG, QUANTITATIVE, PREGNANCY: hCG, Beta Chain, Quant, S: 99761 m[IU]/mL — ABNORMAL HIGH (ref <5)

## 2016-08-01 LAB — RPR: RPR: NONREACTIVE

## 2016-08-01 MED ORDER — METRONIDAZOLE 500 MG PO TABS: 500.0000 mg | ORAL_TABLET | Freq: Two times a day (BID) | ORAL | 0 refills | Status: DC

## 2016-08-01 NOTE — Discharge Instructions (Signed)
Follow up with an obstetrician, as soon as possible for obstetric care.

## 2016-08-01 NOTE — ED Provider Notes (Signed)
[facility]-EMERGENCY DEPT Provider Note   CSN: [id number] Arrival date & time: 07/31/16  2336  By signing my name below, I, Phillis Haggis, attest that this documentation has been prepared under the direction and in the presence of Mancel Bale, MD. Electronically Signed: Phillis Haggis, ED Scribe. 08/01/16. 1:46 AM.  First Provider Contact:  None    History   Chief Complaint Chief Complaint  Patient presents with  . Abdominal Pain   The history is provided by the patient. No language interpreter was used.   HPI Comments: Alicia Wang is a G12P1T1 25 y.o. female who presents to the Emergency Department complaining of constant, cramping LLQ abdominal pain onset 2 days ago. Pt reports associated nausea, decreased appetite, and malodorous urine. Pt LMP was 06/12/16. She has not taken anything for her symptoms. She reports one viable pregnancy and has been pregnant twice. She denies fever, vomiting, dysuria, or vaginal bleeding.   Past Medical History:  Diagnosis Date  . Anemia     Patient Active Problem List   Diagnosis Date Noted  . Cesarean delivery, without mention of indication, delivered, with or without mention of antepartum condition 11/21/2011  . Early/threatened labor 11/20/2011    Past Surgical History:  Procedure Laterality Date  . CESAREAN SECTION  11/20/2011   Procedure: CESAREAN SECTION;  Surgeon: [name], MD;  Location: [facility];  Service: Gynecology;  Laterality: N/A;  primary cesarean section of baby girl  at  2349  APGAR 9/9  . DILATION AND CURETTAGE OF UTERUS      OB History    Gravida Para Term Preterm AB Living   2 1 1  0 1 1   SAB TAB Ectopic Multiple Live Births   1 0 0 0 1       Home Medications    Prior to Admission medications   Medication Sig Start Date End Date Taking? Authorizing Provider  acetaminophen (TYLENOL) 500 MG tablet Take 500 mg by mouth every 6 (six) hours as needed (for pain.).   Yes Historical Provider, MD    Prenatal Vit-Fe Fumarate-FA (PRENATAL MULTIVITAMIN) TABS tablet Take 1 tablet by mouth daily at 12 noon. 03/15/14  Yes Morrell Riddle, PA-C  metroNIDAZOLE (FLAGYL) 500 MG tablet Take 1 tablet (500 mg total) by mouth 2 (two) times daily. One po bid x 7 days 08/01/16   Mancel Bale, MD  norethindrone (ORTHO MICRONOR) 0.35 MG tablet Take 1 tablet (0.35 mg total) by mouth daily. Patient not taking: Reported on 08/01/2016 11/23/11 11/22/12  Antionette Char, MD    Family History History reviewed. No pertinent family history.  Social History Social History  Substance Use Topics  . Smoking status: Former Games developer  . Smokeless tobacco: Never Used  . Alcohol use No     Allergies   Latex   Review of Systems Review of Systems  Constitutional: Positive for appetite change. Negative for fever.  Gastrointestinal: Positive for abdominal pain and nausea. Negative for vomiting.  Genitourinary: Negative for dysuria and vaginal bleeding.  All other systems reviewed and are negative.    Physical Exam Updated Vital Signs BP 110/71 (BP Location: Right Arm)   Pulse 79   Temp 99.2 F (37.3 C) (Oral)   Resp 16   Ht 5\' 4"  (1.626 m)   Wt 133 lb (60.3 kg)   LMP 06/12/2016 (Exact Date)   SpO2 99%   BMI 22.83 kg/m   Physical Exam  Constitutional: She is oriented to person, place, and time. She appears well-developed  and well-nourished.  HENT:  Head: Normocephalic and atraumatic.  Eyes: Conjunctivae and EOM are normal. Pupils are equal, round, and reactive to light.  Neck: Normal range of motion and phonation normal. Neck supple.  Cardiovascular: Normal rate and regular rhythm.   Pulmonary/Chest: Effort normal and breath sounds normal. She exhibits no tenderness.  Abdominal: Soft. She exhibits no distension. There is tenderness in the left lower quadrant. There is no guarding.  Mild LLQ tenderness  Genitourinary:  Genitourinary Comments: Normal external female genitalia. Small amount of opaque  vaginal discharge. No cervical abnormality. Cervix is closed. No blood in vagina. On bimanual examination the uterus is not enlarged. There is mild left adnexal tenderness without palpable mass.  Musculoskeletal: Normal range of motion.  Lower back pain to palpation  Neurological: She is alert and oriented to person, place, and time. She exhibits normal muscle tone.  Skin: Skin is warm and dry.  Psychiatric: She has a normal mood and affect. Her behavior is normal. Judgment and thought content normal.  Nursing note and vitals reviewed.    ED Treatments / Results  DIAGNOSTIC STUDIES: Oxygen Saturation is 100% on RA, normal by my interpretation.    COORDINATION OF CARE: 1:42 AM-Discussed treatment plan which includes labs with pt at bedside and pt agreed to plan.    Labs (all labs ordered are listed, but only abnormal results are displayed) Labs Reviewed  WET PREP, GENITAL - Abnormal; Notable for the following:       Result Value   Clue Cells Wet Prep HPF POC PRESENT (*)    WBC, Wet Prep HPF POC MANY (*)    All other components within normal limits  COMPREHENSIVE METABOLIC PANEL - Abnormal; Notable for the following:    ALT 13 (*)    Alkaline Phosphatase 36 (*)    All other components within normal limits  HCG, QUANTITATIVE, PREGNANCY - Abnormal; Notable for the following:    hCG, Beta Chain, Quant, S 99,761 (*)    All other components within normal limits  I-STAT BETA HCG BLOOD, ED ([facility], [facility], [facility] ONLY) - Abnormal; Notable for the following:    I-stat hCG, quantitative >2,000.0 (*)    All other components within normal limits  LIPASE, BLOOD  CBC  URINALYSIS, ROUTINE W REFLEX MICROSCOPIC (NOT AT [facility])  RPR  HIV ANTIBODY (ROUTINE TESTING)  GC/CHLAMYDIA PROBE AMP ([place]) NOT AT [facility]    EKG  EKG Interpretation None       Radiology US Ob Comp Less 14 Wks  Result Date: 08/01/2016 CLINICAL DATA:  Acute onset of left lower quadrant abdominal pain. Initial encounter.  EXAM: OBSTETRIC <14 WK [place] AND TRANSVAGINAL OB US TECHNIQUE: Both transabdominal and transvaginal ultrasound examinations were performed for complete evaluation of the gestation as well as the maternal uterus, adnexal regions, and pelvic cul-de-sac. Transvaginal technique was performed to assess early pregnancy. COMPARISON:  None. FINDINGS: Intrauterine gestational sac: Single; visualized and normal in shape. Yolk sac:  Yes Embryo:  Yes Cardiac Activity: Yes Heart Rate: 141  bpm CRL:  1.1 cm   7 w   2 d                  [place] EDC: 03/18/2017 Subchorionic hemorrhage: A small amount of subchorionic hemorrhage is noted. Maternal uterus/adnexae: The uterus is otherwise unremarkable. The right ovary measures 3.4 x 1.5 x 2.3 cm, while the left ovary is not visualized. There is no evidence for ovarian torsion. No suspicious adnexal masses are seen.  A small amount of free fluid is seen within the pelvic cul-de-sac. IMPRESSION: 1. Single live intrauterine pregnancy noted, with a crown-rump length of 1.1 cm, corresponding to a gestational age of [redacted] weeks 2 days. This matches the gestational age of [redacted] weeks 1 day by LMP, reflecting an estimated date of delivery of March 19, 2017. 2. Small amount of subchorionic hemorrhage noted. Electronically Signed   By: Roanna Raider M.D.   On: 08/01/2016 04:07   US Ob Transvaginal  Result Date: 08/01/2016 CLINICAL DATA:  Acute onset of left lower quadrant abdominal pain. Initial encounter. EXAM: OBSTETRIC <14 WK [place] AND TRANSVAGINAL OB US TECHNIQUE: Both transabdominal and transvaginal ultrasound examinations were performed for complete evaluation of the gestation as well as the maternal uterus, adnexal regions, and pelvic cul-de-sac. Transvaginal technique was performed to assess early pregnancy. COMPARISON:  None. FINDINGS: Intrauterine gestational sac: Single; visualized and normal in shape. Yolk sac:  Yes Embryo:  Yes Cardiac Activity: Yes Heart Rate: 141  bpm CRL:  1.1 cm   7 w   2 d                   [place] EDC: 03/18/2017 Subchorionic hemorrhage: A small amount of subchorionic hemorrhage is noted. Maternal uterus/adnexae: The uterus is otherwise unremarkable. The right ovary measures 3.4 x 1.5 x 2.3 cm, while the left ovary is not visualized. There is no evidence for ovarian torsion. No suspicious adnexal masses are seen. A small amount of free fluid is seen within the pelvic cul-de-sac. IMPRESSION: 1. Single live intrauterine pregnancy noted, with a crown-rump length of 1.1 cm, corresponding to a gestational age of [redacted] weeks 2 days. This matches the gestational age of [redacted] weeks 1 day by LMP, reflecting an estimated date of delivery of March 19, 2017. 2. Small amount of subchorionic hemorrhage noted. Electronically Signed   By: Roanna Raider M.D.   On: 08/01/2016 04:07    Procedures Procedures (including critical care time)  Medications Ordered in ED Medications - No data to display   Initial Impression / Assessment and Plan / ED Course  I have reviewed the triage vital signs and the nursing notes.  Pertinent labs & imaging results that were available during my care of the patient were reviewed by me and considered in my medical decision making (see chart for details).  Clinical Course  Value Comment By Time  HCG, Beta Francene Finders 16,109 Pregnancy, age not specified Mancel Bale, MD 08/13 [phone]   Medications - No data to display  Patient Vitals for the past 24 hrs:  BP Temp Temp src Pulse Resp SpO2 Height Weight  08/01/16 0411 110/71 - - 79 16 99 % - -  07/31/16 2355 - - - - - - 5\' 4"  (1.626 m) 133 lb (60.3 kg)  07/31/16 2351 112/72 99.2 F (37.3 C) Oral 77 18 100 % - -    4:54 AM Reevaluation with update and discussion. After initial assessment and treatment, an updated evaluation reveals No further complaints. Findings discussed with patient, all questions answered. Aziz Slape L     Final Clinical Impressions(s) / ED Diagnoses   Final diagnoses:  LLQ pain   Nonspecific vaginitis  Pregnancy    Nonspecific abdominal pain with reassuring findings on ultrasound. Urination is likely related to vaginitis. Doubt PID, and pending miscarriage or ectopic pregnancy.  Nursing Notes Reviewed/ Care Coordinated Applicable Imaging Reviewed Interpretation of Laboratory Data incorporated into ED treatment  The patient appears reasonably  screened and/or stabilized for discharge and I doubt any other medical condition or other [facility] requiring further screening, evaluation, or treatment in the ED at this time prior to discharge.  Plan: Home Medications- continue; Home Treatments- rest; return here if the recommended treatment, does not improve the symptoms; Recommended follow up- OB asap     I personally performed the services described in this documentation, which was scribed in my presence. The recorded information has been reviewed and is accurate.   New Prescriptions New Prescriptions   METRONIDAZOLE (FLAGYL) 500 MG TABLET    Take 1 tablet (500 mg total) by mouth 2 (two) times daily. One po bid x 7 days     Mancel BaleElliott Tayjah Lobdell, MD 08/01/16 [id number]

## 2016-08-02 LAB — GC/CHLAMYDIA PROBE AMP (~~LOC~~) NOT AT ARMC
CHLAMYDIA, DNA PROBE: NEGATIVE
Neisseria Gonorrhea: NEGATIVE

## 2016-08-21 ENCOUNTER — Encounter (HOSPITAL_COMMUNITY): Payer: Self-pay | Admitting: *Deleted

## 2016-08-21 ENCOUNTER — Inpatient Hospital Stay (HOSPITAL_COMMUNITY)
Admission: AD | Admit: 2016-08-21 | Discharge: 2016-08-21 | Disposition: A | Discharge status: Home / Self Care | Payer: Medicaid Other | Source: Ambulatory Visit | Attending: Obstetrics & Gynecology | Admitting: Obstetrics & Gynecology

## 2016-08-21 DIAGNOSIS — Z3A10 10 weeks gestation of pregnancy: Secondary | ICD-10-CM | POA: Insufficient documentation

## 2016-08-21 DIAGNOSIS — Z91040 Latex allergy status: Secondary | ICD-10-CM | POA: Diagnosis not present

## 2016-08-21 DIAGNOSIS — O209 Hemorrhage in early pregnancy, unspecified: Secondary | ICD-10-CM | POA: Diagnosis not present

## 2016-08-21 DIAGNOSIS — D649 Anemia, unspecified: Secondary | ICD-10-CM | POA: Insufficient documentation

## 2016-08-21 DIAGNOSIS — Z87891 Personal history of nicotine dependence: Secondary | ICD-10-CM | POA: Insufficient documentation

## 2016-08-21 LAB — URINALYSIS, ROUTINE W REFLEX MICROSCOPIC
Bilirubin Urine: NEGATIVE
Glucose, UA: NEGATIVE mg/dL
Ketones, ur: NEGATIVE mg/dL
LEUKOCYTES UA: NEGATIVE
NITRITE: NEGATIVE
PROTEIN: NEGATIVE mg/dL
Specific Gravity, Urine: 1.02 (ref 1.005–1.030)
pH: 8 (ref 5.0–8.0)

## 2016-08-21 LAB — URINE MICROSCOPIC-ADD ON
BACTERIA UA: NONE SEEN
RBC / HPF: NONE SEEN RBC/hpf (ref 0–5)
WBC UA: NONE SEEN WBC/hpf (ref 0–5)

## 2016-08-21 NOTE — MAU Provider Note (Signed)
  History   25 yo, G3P1011 at 10.0 wks in with spotting, was pink last pm now brown today. Denies gush of fluid or active bleeding. Previous U/s shows small sub. hemorrhage  CSN: [id number]  Arrival date and time: 08/21/16 1248   None     Chief Complaint  Patient presents with  . Vaginal Bleeding   HPI    Past Medical History:  Diagnosis Date  . Anemia     Past Surgical History:  Procedure Laterality Date  . CESAREAN SECTION  11/20/2011   Procedure: CESAREAN SECTION;  Surgeon: Roseanna RainbowLisa A Jackson-Moore, MD;  Location: [facility];  Service: Gynecology;  Laterality: N/A;  primary cesarean section of baby girl  at  2349  APGAR 9/9  . DILATION AND CURETTAGE OF UTERUS      History reviewed. No pertinent family history.  Social History  Substance Use Topics  . Smoking status: Former Games developermoker  . Smokeless tobacco: Never Used  . Alcohol use No    Allergies:  Allergies  Allergen Reactions  . Latex Itching and Rash    Prescriptions Prior to Admission  Medication Sig Dispense Refill Last Dose  . acetaminophen (TYLENOL) 500 MG tablet Take 500 mg by mouth every 6 (six) hours as needed (for pain.).   07/31/2016 at Unknown time  . metroNIDAZOLE (FLAGYL) 500 MG tablet Take 1 tablet (500 mg total) by mouth 2 (two) times daily. One po bid x 7 days 14 tablet 0   . norethindrone (ORTHO MICRONOR) 0.35 MG tablet Take 1 tablet (0.35 mg total) by mouth daily. (Patient not taking: Reported on 08/01/2016) 28 tablet 11 Not Taking at Unknown time  . Prenatal Vit-Fe Fumarate-FA (PRENATAL MULTIVITAMIN) TABS tablet Take 1 tablet by mouth daily at 12 noon. 30 tablet 12 07/31/2016 at Unknown time    Review of Systems  Constitutional: Negative.   HENT: Negative.   Eyes: Negative.   Respiratory: Negative.   Cardiovascular: Negative.   Gastrointestinal: Positive for abdominal pain.  Genitourinary: Negative.   Musculoskeletal: Negative.   Skin: Negative.   Neurological: Negative.   Endo/Heme/Allergies:  Negative.   Psychiatric/Behavioral: Negative.    Physical Exam   Blood pressure (!) 104/53, pulse 70, temperature 97.8 F (36.6 C), temperature source Oral, resp. rate 16, last menstrual period 06/12/2016, unknown if currently breastfeeding.  Physical Exam  Constitutional: She is oriented to person, place, and time. She appears well-developed.  HENT:  Head: Normocephalic.  Eyes: Pupils are equal, round, and reactive to light.  Neck: Normal range of motion. Neck supple.  Cardiovascular: Normal rate, regular rhythm and normal heart sounds.   Respiratory: Effort normal and breath sounds normal.  GI: Soft. Bowel sounds are normal.  Genitourinary: Vaginal discharge found.  Genitourinary Comments: Scant brownish discharge noted   Musculoskeletal: Normal range of motion.  Neurological: She is alert and oriented to person, place, and time. She has normal reflexes.  Skin: Skin is warm and dry.  Psychiatric: She has a normal mood and affect. Her behavior is normal. Judgment and thought content normal.    MAU Course  Procedures  MDM Subchoronic hemorrhage at 10.0 wks  Assessment and Plan  Scant amt of brownish bleeding noted, bedside abd u/s shows active viable IUP at 10.0 wks, will d/c home, to follow up in clinic   Alicia Wang 08/21/2016, 1:10 PM

## 2016-08-21 NOTE — MAU Note (Signed)
Pt states she is having vaginal bleeding that started last night.  She states it was pink spotting when she wiped last night and today it is brown.

## 2016-08-21 NOTE — Discharge Instructions (Signed)
Hemorragia vaginal durante el embarazo (primer trimestre)  (Vaginal Bleeding During Pregnancy, First Trimester)  Durante los primeros meses de embarazo, es común tener una pequeña hemorragia vaginal (manchas). A veces, la hemorragia es normal y no representa un problema, pero en algunas ocasiones es un síntoma de algo grave. Asegúrese de decirle a su médico de inmediato si tiene algún tipo de hemorragia vaginal.  CUIDADOS EN EL HOGAR  · Controle su afección para ver si hay cambios.  · Siga las indicaciones de su médico con respecto al grado de actividad que puede tener.  · Si debe hacer reposo en cama:    Es posible que deba quedarse en cama y levantarse únicamente para ir al baño.    Quizás le permitan hacer algunas actividades.    Si es necesario, planifique que alguien la ayude.  · Escriba:    La cantidad de toallas higiénicas que usa cada día.    La frecuencia con la que se cambia las toallas higiénicas.    Indique que tan empapados (saturados) están.  · No use tampones.  · No se haga duchas vaginales.  · No tenga relaciones sexuales ni orgasmos hasta que el médico la autorice.  · Si elimina tejido por la vagina, guárdelo para mostrárselo al médico.  · Tome los medicamentos solamente como se lo haya indicado el médico.  · No tome aspirina, ya que puede causar hemorragias.  · Concurra a todas las visitas de control como se lo haya indicado el médico.  SOLICITE AYUDA SI:   · Tiene una hemorragia vaginal.  · Tiene cólicos.  · Tiene dolores de parto.  · Tiene fiebre que no desaparece después de tomar medicamentos.  SOLICITE AYUDA DE INMEDIATO SI:   · Siente cólicos muy intensos en la espalda o en el vientre (abdomen).  · Elimina coágulos grandes o tejido por la vagina.  · Tiene más hemorragia.  · Se siente débil o que va a desvanecerse.  · Pierde el conocimiento (se desmaya).  · Tiene escalofríos.  · Tiene una pérdida importante o sale líquido a borbotones por la vagina.  · Se desmaya mientras defeca.  ASEGÚRESE DE  QUE:  · Comprende estas instrucciones.  · Controlará su afección.  · Recibirá ayuda de inmediato si no mejora o si empeora.     Esta información no tiene como fin reemplazar el consejo del médico. Asegúrese de hacerle al médico cualquier pregunta que tenga.     Document Released: 04/22/2014  Elsevier Interactive Patient Education ©2016 Elsevier Inc.

## 2016-09-13 ENCOUNTER — Other Ambulatory Visit (HOSPITAL_COMMUNITY): Payer: Self-pay | Admitting: Nurse Practitioner

## 2016-09-13 DIAGNOSIS — Z369 Encounter for antenatal screening, unspecified: Secondary | ICD-10-CM

## 2016-09-13 LAB — OB RESULTS CONSOLE GC/CHLAMYDIA: Gonorrhea: NEGATIVE

## 2016-09-13 LAB — OB RESULTS CONSOLE HEPATITIS B SURFACE ANTIGEN: Hepatitis B Surface Ag: NEGATIVE

## 2016-09-13 LAB — OB RESULTS CONSOLE ANTIBODY SCREEN: Antibody Screen: NEGATIVE

## 2016-09-13 LAB — OB RESULTS CONSOLE ABO/RH: RH Type: POSITIVE

## 2016-09-13 LAB — OB RESULTS CONSOLE RUBELLA ANTIBODY, IGM: Rubella: IMMUNE

## 2016-09-16 ENCOUNTER — Telehealth: Payer: Self-pay | Admitting: Obstetrics & Gynecology

## 2016-09-16 NOTE — Telephone Encounter (Signed)
Called patient about appointment. Her phone rang about 4 times, then was disconnected. Will keep trying.

## 2016-09-20 ENCOUNTER — Encounter: Payer: Self-pay | Admitting: Family Medicine

## 2016-09-20 ENCOUNTER — Encounter: Payer: Medicaid Other | Admitting: Certified Nurse Midwife

## 2016-09-23 ENCOUNTER — Encounter: Payer: Self-pay | Admitting: Family Medicine

## 2016-09-23 ENCOUNTER — Encounter: Payer: Medicaid Other | Admitting: Family

## 2016-09-23 DIAGNOSIS — O0990 Supervision of high risk pregnancy, unspecified, unspecified trimester: Secondary | ICD-10-CM | POA: Insufficient documentation

## 2016-09-23 DIAGNOSIS — O34219 Maternal care for unspecified type scar from previous cesarean delivery: Secondary | ICD-10-CM | POA: Insufficient documentation

## 2016-10-19 ENCOUNTER — Inpatient Hospital Stay (HOSPITAL_COMMUNITY)
Admission: AD | Admit: 2016-10-19 | Discharge: 2016-10-19 | Disposition: A | Discharge status: Home / Self Care | Payer: Medicaid Other | Source: Ambulatory Visit | Attending: Family Medicine | Admitting: Family Medicine

## 2016-10-19 ENCOUNTER — Encounter (HOSPITAL_COMMUNITY): Payer: Self-pay | Admitting: *Deleted

## 2016-10-19 DIAGNOSIS — Z3A18 18 weeks gestation of pregnancy: Secondary | ICD-10-CM | POA: Insufficient documentation

## 2016-10-19 DIAGNOSIS — O26899 Other specified pregnancy related conditions, unspecified trimester: Secondary | ICD-10-CM

## 2016-10-19 DIAGNOSIS — R102 Pelvic and perineal pain: Secondary | ICD-10-CM | POA: Diagnosis not present

## 2016-10-19 DIAGNOSIS — Z91040 Latex allergy status: Secondary | ICD-10-CM | POA: Diagnosis not present

## 2016-10-19 DIAGNOSIS — O26892 Other specified pregnancy related conditions, second trimester: Secondary | ICD-10-CM | POA: Insufficient documentation

## 2016-10-19 DIAGNOSIS — R109 Unspecified abdominal pain: Secondary | ICD-10-CM | POA: Diagnosis present

## 2016-10-19 DIAGNOSIS — O219 Vomiting of pregnancy, unspecified: Secondary | ICD-10-CM | POA: Insufficient documentation

## 2016-10-19 DIAGNOSIS — Z87891 Personal history of nicotine dependence: Secondary | ICD-10-CM | POA: Diagnosis not present

## 2016-10-19 LAB — URINALYSIS, ROUTINE W REFLEX MICROSCOPIC
BILIRUBIN URINE: NEGATIVE
Glucose, UA: NEGATIVE mg/dL
KETONES UR: NEGATIVE mg/dL
Leukocytes, UA: NEGATIVE
Nitrite: NEGATIVE
PROTEIN: NEGATIVE mg/dL
Specific Gravity, Urine: >1.03 — ABNORMAL HIGH (ref 1.005–1.030)
pH: 6 (ref 5.0–8.0)

## 2016-10-19 LAB — URINE MICROSCOPIC-ADD ON

## 2016-10-19 MED ORDER — COMFORT FIT MATERNITY SUPP SM MISC: 1.0000 [IU] | Freq: Every day | 0 refills | Status: DC | PRN

## 2016-10-19 MED ORDER — ONDANSETRON 4 MG PO TBDP: 4.0000 mg | ORAL_TABLET | Freq: Three times a day (TID) | ORAL | 0 refills | Status: DC | PRN

## 2016-10-19 NOTE — Discharge Instructions (Signed)
Dolor abdominal durante el embarazo (Abdominal Pain During Pregnancy) El dolor de vientre (abdominal) es habitual durante el embarazo. Generalmente no se trata de un problema grave. Otras veces puede ser un signo de que algo no anda bien. Siempre comunquese con su mdico si tiene dolor abdominal. CUIDADOS EN EL HOGAR Controle el dolor para ver si hay cambios. Las indicaciones que siguen pueden ayudarla a sentirse mejor:  Hospital doctorotenga sexo (relaciones sexuales) ni se coloque nada dentro de la vagina hasta que se sienta mejor.  Haga reposo hasta que el dolor se calme.  Si siente ganas de vomitar (nuseas ) beba lquidos claros. No consuma alimentos slidos hasta que se sienta mejor.  Slo tome los medicamentos que le haya indicado su mdico.  Cumpla con las visitas al mdico segn las indicaciones. SOLICITE AYUDA DE INMEDIATO SI:   Tiene un sangrado, pierde lquido o elimina trozos de tejido por la vagina.  Siente ms dolor o clicos.  Comienza a vomitar.  Siente dolor al orinar u observa sangre en la orina.  Tiene fiebre.  No siente que el beb se mueva mucho.  Se siente muy dbil o cree que va a desmayarse.  Tiene dificultad para respirar con o sin dolor en el vientre.  Siente un dolor de cabeza muy intenso y Engineer, miningdolor en el vientre.  Observa que sale un lquido por la vagina y tiene dolor abdominal.  La materia fecal es lquida (diarrea).  El dolor en el viente no desaparece, o empeora, luego de hacer reposo. ASEGRESE DE QUE:   Comprende estas instrucciones.  Controlar su afeccin.  Recibir ayuda de inmediato si no mejora o si empeora.   Esta informacin no tiene Theme park managercomo fin reemplazar el consejo del mdico. Asegrese de hacerle al mdico cualquier pregunta que tenga.   Document Released: 08/18/2011 Document Revised: 08/08/2013 Elsevier Interactive Patient Education 2016 [employer] Dolor del ligamento redondo (Round Ligament Pain) El ligamento redondo es un  cordn de msculo y tejido que sirve de sostn para Careers information officerel tero. Puede volverse una fuente de dolor durante el embarazo si se distiende o se torsiona a medida que el beb crece. Generalmente, el dolor [facility] Hospitalsempieza en el segundo trimestre de [place]azo, y Software engineerpuede aparecer y Landscape architectdesaparecer hasta el momento del Endicottparto. No se trata de un problema grave y no es perjudicial para el beb. El dolor del ligamento redondo suele ser agudo y punzante, y durar poco tiempo, pero tambin puede ser sordo, persistente y continuo. Se lo percibe en la regin inferior del abdomen o en la ingle. A menudo comienza en la zona ms profunda de la ingle y se extiende hacia regin externa de la cadera. El Software engineerdolor puede aparecer en los siguientes casos:  Al cambiar repentinamente de posicin.  Al darse vuelta en la cama.  Al toser o estornudar.  Al realizar actividad fsica. INSTRUCCIONES PARA EL CUIDADO EN EL HOGAR Controle su afeccin para ver si hay cambios. Siga estos pasos para Acupuncturistaliviar el dolor:  Cuando el dolor comience, reljese. Luego intente lo siguiente:  Sentarse.  Flexionar las rodillas hacia el abdomen.  Acostarse de costado con una almohada debajo del abdomen y Eastman Chemicalotra entre las piernas.  Sentarse en una baera con agua tibia durante 15 a 20minutos o hasta que el dolor desaparezca.  Tome los medicamentos de venta libre y los recetados solamente como se lo haya indicado el mdico.  Haga movimientos lentos al sentarse y pararse.  No haga caminatas largas si le generan dolor.  Suspenda o reduzca  las actividades fsicas si le Games developergeneran dolor. SOLICITE ATENCIN MDICA SI:  El dolor no desaparece con Scientist, research (medical)el tratamiento.  Tiene un dolor en la espalda que no tena antes.  El medicamento no resulta eficaz. SOLICITE ATENCIN MDICA DE INMEDIATO SI:  Tiene escalofros o fiebre.  Tiene contracciones uterinas.  Presenta hemorragia vaginal.  Siente nuseas o vmitos.  Tiene diarrea.  Siente dolor al ConocoPhillipsorinar.   Esta  informacin no tiene Theme park managercomo fin reemplazar el consejo del mdico. Asegrese de hacerle al mdico cualquier pregunta que tenga.   Document Released: 11/18/2008 Document Revised: 02/28/2012 Elsevier Interactive Patient Education [employer]2016 Elsevier Inc.

## 2016-10-19 NOTE — MAU Provider Note (Signed)
History     CSN: [id number]  Arrival date and time: 10/19/16 1059   First Provider Initiated Contact with Patient 10/19/16 1215      Chief Complaint  Patient presents with  . Abdominal Pain   HPI Alicia Wang is a 25 y.o. G3P1011 at 2856w3d who presents with abdominal pain. Symptoms began 3 days ago. Reports intermittent lower abdominal pain that is sharp. Worse with walking. Rates pain 6/10. Took tylenol the other day with mild relief. Has some nausea & vomiting for the last 3 days. No vomiting today. Denies vaginal bleeding, LOF, dysuria, fever, diarrhea, or constipation. [facility].   OB History    Gravida Para Term Preterm AB Living   3 1 1  0 1 1   SAB TAB Ectopic Multiple Live Births   1 0 0 0 1      Past Medical History:  Diagnosis Date  . Anemia     Past Surgical History:  Procedure Laterality Date  . CESAREAN SECTION  11/20/2011   Procedure: CESAREAN SECTION;  Surgeon: Roseanna RainbowLisa A Jackson-Moore, MD;  Location: [facility];  Service: Gynecology;  Laterality: N/A;  primary cesarean section of baby girl  at  2349  APGAR 9/9  . DILATION AND CURETTAGE OF UTERUS      History reviewed. No pertinent family history.  Social History  Substance Use Topics  . Smoking status: Former Games developermoker  . Smokeless tobacco: Never Used  . Alcohol use No    Allergies:  Allergies  Allergen Reactions  . Latex Itching and Rash    Prescriptions Prior to Admission  Medication Sig Dispense Refill Last Dose  . acetaminophen (TYLENOL) 500 MG tablet Take 500 mg by mouth every 6 (six) hours as needed (for pain.).   10/18/2016 at Unknown time  . Prenatal Vit-Fe Fumarate-FA (PRENATAL MULTIVITAMIN) TABS tablet Take 1 tablet by mouth daily at 12 noon. 30 tablet 12 10/18/2016 at Unknown time    Review of Systems  Constitutional: Negative.   Gastrointestinal: Positive for abdominal pain, nausea and vomiting. Negative for constipation and diarrhea.  Genitourinary: Negative.     Physical Exam   Blood pressure 102/56, pulse 80, temperature 98.3 F (36.8 C), temperature source Oral, resp. rate 16, weight 134 lb (60.8 kg), last menstrual period 06/12/2016, unknown if currently breastfeeding.  Physical Exam  Nursing note and vitals reviewed. Constitutional: She is oriented to person, place, and time. She appears well-developed and well-nourished. No distress.  HENT:  Head: Normocephalic and atraumatic.  Eyes: Conjunctivae are normal. Right eye exhibits no discharge. Left eye exhibits no discharge. No scleral icterus.  Neck: Normal range of motion.  Cardiovascular: Normal rate, regular rhythm and normal heart sounds.   No murmur heard. Respiratory: Effort normal and breath sounds normal. No respiratory distress. She has no wheezes.  GI: Soft. Bowel sounds are normal. There is no tenderness.  Neurological: She is alert and oriented to person, place, and time.  Skin: Skin is warm and dry. She is not diaphoretic.  Psychiatric: She has a normal mood and affect. Her behavior is normal. Judgment and thought content normal.   Dilation: Closed Effacement (%): Thick Cervical Position: Posterior Exam by:: Judeth HornErin Raedyn Wenke NP  MAU Course  Procedures Results for orders placed or performed during the hospital encounter of 10/19/16 (from the past 24 hour(s))  Urinalysis, Routine w reflex microscopic (not [facility])     Status: Abnormal   Collection Time: 10/19/16 11:17 AM  Result Value Ref Range  Color, Urine YELLOW YELLOW   APPearance HAZY (A) CLEAR   Specific Gravity, Urine >1.030 (H) 1.005 - 1.030   pH 6.0 5.0 - 8.0   Glucose, UA NEGATIVE NEGATIVE mg/dL   Hgb urine dipstick TRACE (A) NEGATIVE   Bilirubin Urine NEGATIVE NEGATIVE   Ketones, ur NEGATIVE NEGATIVE mg/dL   Protein, ur NEGATIVE NEGATIVE mg/dL   Nitrite NEGATIVE NEGATIVE   Leukocytes, UA NEGATIVE NEGATIVE  Urine microscopic-add on     Status: Abnormal   Collection Time: 10/19/16 11:17 AM  Result Value  Ref Range   Squamous Epithelial / LPF 0-5 (A) NONE SEEN   WBC, UA 0-5 0 - 5 WBC/hpf   RBC / HPF 0-5 0 - 5 RBC/hpf   Bacteria, UA MANY (A) NONE SEEN   Urine-Other MUCOUS PRESENT     MDM FHT 132 by doppler Cervix closed Currently no pain  Assessment and Plan  A: 1. Pain of round ligament during pregnancy   2. Nausea and vomiting during pregnancy prior to [redacted] weeks gestation    P: Discharge home Rx phenergan Increase water intake Discussed reasons to return to MAU Keep f/u with OB  Judeth HornErin Shamar Kracke 10/19/2016, 11:56 AM

## 2016-10-19 NOTE — MAU Note (Addendum)
Pain in lower abd. Started about 4 days ago, has gotten worse. Esp when walking and standing.

## 2016-10-26 ENCOUNTER — Encounter (HOSPITAL_COMMUNITY): Payer: Self-pay

## 2016-10-26 ENCOUNTER — Inpatient Hospital Stay (HOSPITAL_COMMUNITY)
Admission: AD | Admit: 2016-10-26 | Discharge: 2016-10-26 | Disposition: A | Discharge status: Home / Self Care | Payer: Medicaid Other | Source: Ambulatory Visit | Attending: Family Medicine | Admitting: Family Medicine

## 2016-10-26 DIAGNOSIS — O219 Vomiting of pregnancy, unspecified: Secondary | ICD-10-CM

## 2016-10-26 DIAGNOSIS — Z3A19 19 weeks gestation of pregnancy: Secondary | ICD-10-CM

## 2016-10-26 DIAGNOSIS — Z91040 Latex allergy status: Secondary | ICD-10-CM | POA: Diagnosis not present

## 2016-10-26 DIAGNOSIS — R1110 Vomiting, unspecified: Secondary | ICD-10-CM | POA: Diagnosis present

## 2016-10-26 DIAGNOSIS — Z87891 Personal history of nicotine dependence: Secondary | ICD-10-CM | POA: Insufficient documentation

## 2016-10-26 DIAGNOSIS — O0992 Supervision of high risk pregnancy, unspecified, second trimester: Secondary | ICD-10-CM | POA: Insufficient documentation

## 2016-10-26 DIAGNOSIS — O0990 Supervision of high risk pregnancy, unspecified, unspecified trimester: Secondary | ICD-10-CM

## 2016-10-26 LAB — COMPREHENSIVE METABOLIC PANEL
ALT: 14 U/L (ref 14–54)
AST: 16 U/L (ref 15–41)
Albumin: 3.5 g/dL (ref 3.5–5.0)
Alkaline Phosphatase: 44 U/L (ref 38–126)
Anion gap: 7 (ref 5–15)
BUN: 6 mg/dL (ref 6–20)
CHLORIDE: 102 mmol/L (ref 101–111)
CO2: 25 mmol/L (ref 22–32)
Calcium: 8.8 mg/dL — ABNORMAL LOW (ref 8.9–10.3)
Creatinine, Ser: 0.42 mg/dL — ABNORMAL LOW (ref 0.44–1.00)
Glucose, Bld: 82 mg/dL (ref 65–99)
POTASSIUM: 3.4 mmol/L — AB (ref 3.5–5.1)
SODIUM: 134 mmol/L — AB (ref 135–145)
Total Bilirubin: 0.7 mg/dL (ref 0.3–1.2)
Total Protein: 6.7 g/dL (ref 6.5–8.1)

## 2016-10-26 LAB — URINALYSIS, ROUTINE W REFLEX MICROSCOPIC
BILIRUBIN URINE: NEGATIVE
Glucose, UA: NEGATIVE mg/dL
HGB URINE DIPSTICK: NEGATIVE
KETONES UR: 15 mg/dL — AB
Nitrite: NEGATIVE
PROTEIN: NEGATIVE mg/dL
SPECIFIC GRAVITY, URINE: 1.01 (ref 1.005–1.030)
pH: 7.5 (ref 5.0–8.0)

## 2016-10-26 LAB — CBC
HCT: 32.3 % — ABNORMAL LOW (ref 36.0–46.0)
Hemoglobin: 11.2 g/dL — ABNORMAL LOW (ref 12.0–15.0)
MCH: 32.6 pg (ref 26.0–34.0)
MCHC: 34.7 g/dL (ref 30.0–36.0)
MCV: 93.9 fL (ref 78.0–100.0)
PLATELETS: 269 10*3/uL (ref 150–400)
RBC: 3.44 MIL/uL — ABNORMAL LOW (ref 3.87–5.11)
RDW: 13.6 % (ref 11.5–15.5)
WBC: 8.3 10*3/uL (ref 4.0–10.5)

## 2016-10-26 LAB — URINE MICROSCOPIC-ADD ON: RBC / HPF: NONE SEEN RBC/hpf (ref 0–5)

## 2016-10-26 MED ORDER — ONDANSETRON HCL 4 MG/2ML IJ SOLN
4.0000 mg | Freq: Once | INTRAMUSCULAR | Status: AC
  Administered 2016-10-26: 4 mg via INTRAVENOUS
  Filled 2016-10-26: qty 2

## 2016-10-26 MED ORDER — LACTATED RINGERS IV BOLUS (SEPSIS)
1000.0000 mL | Freq: Once | INTRAVENOUS | Status: AC
  Administered 2016-10-26: 1000 mL via INTRAVENOUS

## 2016-10-26 MED ORDER — PROMETHAZINE HCL 25 MG PO TABS: 12.5000 mg | ORAL_TABLET | Freq: Four times a day (QID) | ORAL | 0 refills | Status: DC | PRN

## 2016-10-26 MED ORDER — FAMOTIDINE IN NACL 20-0.9 MG/50ML-% IV SOLN
20.0000 mg | Freq: Once | INTRAVENOUS | Status: AC
  Administered 2016-10-26: 20 mg via INTRAVENOUS
  Filled 2016-10-26: qty 50

## 2016-10-26 MED ORDER — PROMETHAZINE HCL 25 MG/ML IJ SOLN
12.5000 mg | Freq: Once | INTRAMUSCULAR | Status: AC
  Administered 2016-10-26: 12.5 mg via INTRAVENOUS
  Filled 2016-10-26: qty 1

## 2016-10-26 MED ORDER — FAMOTIDINE 40 MG PO TABS: 40.0000 mg | ORAL_TABLET | Freq: Every day | ORAL | 3 refills | Status: DC

## 2016-10-26 NOTE — MAU Note (Signed)
Pt states that about 2 hours ago she started vomiting and there was a lot of blood. States she has not had vomiting during this pregnancy. Denies diarrhea. Has not felt baby move since this afternoon. Denies fever or being around anyone sick. Denies sore throat, cough, etc.

## 2016-10-26 NOTE — Discharge Instructions (Signed)

## 2016-10-26 NOTE — MAU Provider Note (Signed)
History     CSN: [id number]  Arrival date and time: 10/26/16 2019   First Provider Initiated Contact with Patient 10/26/16 2138      Chief Complaint  Patient presents with  . Emesis   Emesis   This is a new problem. The current episode started today. The problem occurs 2 to 4 times per day. The problem has been unchanged. The emesis has an appearance of bright red blood. There has been no fever. Pertinent negatives include no abdominal pain, chills, coughing, diarrhea or fever. Risk factors: pregnancy  She has tried nothing for the symptoms. The treatment provided no relief.   Past Medical History:  Diagnosis Date  . Anemia     Past Surgical History:  Procedure Laterality Date  . CESAREAN SECTION  11/20/2011   Procedure: CESAREAN SECTION;  Surgeon: Roseanna RainbowLisa A Jackson-Moore, MD;  Location: [facility];  Service: Gynecology;  Laterality: N/A;  primary cesarean section of baby girl  at  2349  APGAR 9/9  . DILATION AND CURETTAGE OF UTERUS      History reviewed. No pertinent family history.  Social History  Substance Use Topics  . Smoking status: Former Games developermoker  . Smokeless tobacco: Never Used  . Alcohol use No    Allergies:  Allergies  Allergen Reactions  . Latex Itching and Rash    Prescriptions Prior to Admission  Medication Sig Dispense Refill Last Dose  . acetaminophen (TYLENOL) 500 MG tablet Take 500 mg by mouth every 6 (six) hours as needed (for pain.).   Past Week at Unknown time  . Prenatal Vit-Fe Fumarate-FA (PRENATAL MULTIVITAMIN) TABS tablet Take 1 tablet by mouth daily at 12 noon. 30 tablet 12 10/25/2016 at Unknown time  . Elastic Bandages & Supports (COMFORT FIT MATERNITY SUPP SM) MISC 1 Units by Does not apply route daily as needed. 1 each 0   . ondansetron (ZOFRAN ODT) 4 MG disintegrating tablet Take 1 tablet (4 mg total) by mouth every 8 (eight) hours as needed for nausea or vomiting. (Patient not taking: Reported on 10/26/2016) 15 tablet 0 Not Taking at Unknown time     Review of Systems  Constitutional: Negative for chills and fever.  HENT: Negative for sore throat.   Respiratory: Negative for cough.   Gastrointestinal: Positive for nausea and vomiting. Negative for abdominal pain, constipation, diarrhea and heartburn.   Physical Exam   Blood pressure 103/60, pulse 69, temperature 98.6 F (37 C), temperature source Oral, resp. rate 16, height 5\' 4"  (1.626 m), weight 132 lb 12.8 oz (60.2 kg), last menstrual period 06/12/2016, SpO2 99 %, unknown if currently breastfeeding.  Physical Exam  Nursing note and vitals reviewed. Constitutional: She is oriented to person, place, and time. She appears well-developed and well-nourished. No distress.  HENT:  Head: Normocephalic.  Cardiovascular: Normal rate.   Respiratory: Effort normal.  GI: Soft. There is no tenderness. There is no rebound.  Neurological: She is alert and oriented to person, place, and time.  Skin: Skin is warm and dry.  Psychiatric: She has a normal mood and affect.    MAU Course  Procedures  MDM Patient had had 1L of LR, phenergan, pepcid and zofran. She reports feeling better, and it tolerating PO.   Assessment and Plan   1. Nausea and vomiting during pregnancy prior to [redacted] weeks gestation   2. [redacted] weeks gestation of pregnancy   3. Supervision of high risk pregnancy, antepartum    DC home Comfort measures reviewed  2nd Trimester precautions  PTL precautions  Fetal kick counts RX: pepcid Q day, phenergan PRN #[id number]  Return to MAU as needed FU with OB as planned  Follow-up Information    [facility].   Specialty:  Obstetrics and Gynecology Contact information: [address] [place] [id number] [phone]           Tawnya CrookHogan, Hatice Bubel Donovan 10/26/2016, 9:40 PM

## 2016-12-20 NOTE — L&D Delivery Note (Signed)
Vaginal Delivery Note  26 y.o. G3P1011 at 723w1d delivered a viable female infant at 1219 in cephalic, LOA position. 1 loose nuchal cord x 1. Right anterior shoulder delivered with ease. 60 sec delayed cord clamping. Cord clamped x2 and cut. Placenta delivered spontaneously intact, with 3VC. Fundus firm on exam with massage and pitocin.  Mother: Anesthesia: epidural Laceration: none Suture repair: NA EBL: 100 mL  Baby: Apgars: 9, 9 Weight: pending Cord pH: NA  Good hemostasis noted. Mom to postpartum.  Baby to Couplet care / Skin to Skin.  Gwenevere AbbotNimeka Phillip, MD, PGY-2 03/13/2017, 12:32 PM

## 2017-01-14 ENCOUNTER — Encounter: Payer: Self-pay | Admitting: Obstetrics and Gynecology

## 2017-01-14 DIAGNOSIS — O34219 Maternal care for unspecified type scar from previous cesarean delivery: Secondary | ICD-10-CM

## 2017-01-14 NOTE — Progress Notes (Signed)
[facility]  MD Consult 01/14/2017 CC: delivery planning HPI: 26 y/o G3P1011 @ 30/6 with h/o 2012 pLTCS with double layer uterine closure for arrest of descent; birth weight was 3685gm.  PMHx, SurgHx: [facility]. normal 1hr GCT and 28wk labs.  A/P: pt doing well Labor notes only state she got to complete; no mention in the op note or delivery notes re: fetal position. She did present to the hospital in spontaneous labor.  In talking to the patient she states that she was pushing for several hours and then got a c/s. It also looks like she had IAI that was brewing. I d/w her the standard risks of TOLAC, namely approximately 1% risk of uterine rupture, but I told her that her biggest risk is another repeat scenario where she got to complete and has arrest of descent. MFMu calculator puts her chances at 50/50 and I told her that at this point I'm leaning towards recommending a repeat c-section. She is still very interested in trying, so I told her that I'd recommend doing a growth u/s at 36-37wks and following [facility] closely. I told her that 3rd trimester u/s are notoriously inaccurate but it's the best assessment we have and if she is measuring very large that she should strongly consider doing a schedule repeat c-section. Notes left in her [facility] chart to please order the scan at 36-37wks.  Interpreter used and TOLAC form signed.   Alicia Wang, Jr MD Attending Center [facility] (Faculty Practice) 01/14/2017 Time: [phone]

## 2017-02-21 LAB — OB RESULTS CONSOLE GBS: STREP GROUP B AG: NEGATIVE

## 2017-03-10 ENCOUNTER — Other Ambulatory Visit: Payer: Self-pay | Admitting: Advanced Practice Midwife

## 2017-03-13 ENCOUNTER — Inpatient Hospital Stay (HOSPITAL_COMMUNITY)
Admission: AD | Admit: 2017-03-13 | Discharge: 2017-03-14 | DRG: 775 | Disposition: A | Discharge status: Home / Self Care | Payer: Medicaid Other | Source: Ambulatory Visit | Attending: Obstetrics & Gynecology | Admitting: Obstetrics & Gynecology

## 2017-03-13 ENCOUNTER — Inpatient Hospital Stay (HOSPITAL_COMMUNITY): Payer: Medicaid Other | Admitting: Anesthesiology

## 2017-03-13 ENCOUNTER — Encounter (HOSPITAL_COMMUNITY): Payer: Self-pay | Admitting: *Deleted

## 2017-03-13 DIAGNOSIS — O34211 Maternal care for low transverse scar from previous cesarean delivery: Secondary | ICD-10-CM | POA: Diagnosis present

## 2017-03-13 DIAGNOSIS — O0990 Supervision of high risk pregnancy, unspecified, unspecified trimester: Secondary | ICD-10-CM

## 2017-03-13 DIAGNOSIS — Z87891 Personal history of nicotine dependence: Secondary | ICD-10-CM | POA: Diagnosis not present

## 2017-03-13 DIAGNOSIS — Z3A39 39 weeks gestation of pregnancy: Secondary | ICD-10-CM

## 2017-03-13 DIAGNOSIS — Z3493 Encounter for supervision of normal pregnancy, unspecified, third trimester: Secondary | ICD-10-CM | POA: Diagnosis present

## 2017-03-13 DIAGNOSIS — O34219 Maternal care for unspecified type scar from previous cesarean delivery: Secondary | ICD-10-CM

## 2017-03-13 LAB — CBC
HEMATOCRIT: 38 % (ref 36.0–46.0)
Hemoglobin: 12.7 g/dL (ref 12.0–15.0)
MCH: 33.1 pg (ref 26.0–34.0)
MCHC: 33.4 g/dL (ref 30.0–36.0)
MCV: 99 fL (ref 78.0–100.0)
PLATELETS: 251 10*3/uL (ref 150–400)
RBC: 3.84 MIL/uL — AB (ref 3.87–5.11)
RDW: 13.4 % (ref 11.5–15.5)
WBC: 9.1 10*3/uL (ref 4.0–10.5)

## 2017-03-13 LAB — TYPE AND SCREEN
ABO/RH(D): O POS
ANTIBODY SCREEN: NEGATIVE

## 2017-03-13 LAB — ABO/RH: ABO/RH(D): O POS

## 2017-03-13 MED ORDER — EPHEDRINE 5 MG/ML INJ: 10.0000 mg | INTRAVENOUS | Status: DC | PRN

## 2017-03-13 MED ORDER — PRENATAL MULTIVITAMIN CH
1.0000 | ORAL_TABLET | Freq: Every day | ORAL | Status: DC
  Administered 2017-03-14: 1 via ORAL
  Filled 2017-03-13: qty 1

## 2017-03-13 MED ORDER — COCONUT OIL OIL: 1.0000 "application " | TOPICAL_OIL | Status: DC | PRN

## 2017-03-13 MED ORDER — LIDOCAINE HCL (PF) 1 % IJ SOLN
30.0000 mL | INTRAMUSCULAR | Status: DC | PRN
  Filled 2017-03-13: qty 30

## 2017-03-13 MED ORDER — ONDANSETRON HCL 4 MG/2ML IJ SOLN: 4.0000 mg | INTRAMUSCULAR | Status: DC | PRN

## 2017-03-13 MED ORDER — IBUPROFEN 600 MG PO TABS
600.0000 mg | ORAL_TABLET | Freq: Four times a day (QID) | ORAL | Status: DC
  Administered 2017-03-13 – 2017-03-14 (×4): 600 mg via ORAL
  Filled 2017-03-13 (×4): qty 1

## 2017-03-13 MED ORDER — OXYCODONE-ACETAMINOPHEN 5-325 MG PO TABS: 1.0000 | ORAL_TABLET | ORAL | Status: DC | PRN

## 2017-03-13 MED ORDER — SIMETHICONE 80 MG PO CHEW: 80.0000 mg | CHEWABLE_TABLET | ORAL | Status: DC | PRN

## 2017-03-13 MED ORDER — PHENYLEPHRINE 40 MCG/ML (10ML) SYRINGE FOR IV PUSH (FOR BLOOD PRESSURE SUPPORT)
80.0000 ug | PREFILLED_SYRINGE | INTRAVENOUS | Status: DC | PRN
  Filled 2017-03-13: qty 10

## 2017-03-13 MED ORDER — TETANUS-DIPHTH-ACELL PERTUSSIS 5-2.5-18.5 LF-MCG/0.5 IM SUSP
0.5000 mL | Freq: Once | INTRAMUSCULAR | Status: DC
Start: 2017-03-14 — End: 2017-03-14
  Filled 2017-03-13: qty 0.5

## 2017-03-13 MED ORDER — WITCH HAZEL-GLYCERIN EX PADS: 1.0000 "application " | MEDICATED_PAD | CUTANEOUS | Status: DC | PRN

## 2017-03-13 MED ORDER — ACETAMINOPHEN 325 MG PO TABS: 650.0000 mg | ORAL_TABLET | ORAL | Status: DC | PRN

## 2017-03-13 MED ORDER — FLEET ENEMA 7-19 GM/118ML RE ENEM: 1.0000 | ENEMA | RECTAL | Status: DC | PRN

## 2017-03-13 MED ORDER — LIDOCAINE HCL (PF) 1 % IJ SOLN
INTRAMUSCULAR | Status: DC | PRN
  Administered 2017-03-13 (×2): 5 mL

## 2017-03-13 MED ORDER — ONDANSETRON HCL 4 MG/2ML IJ SOLN: 4.0000 mg | Freq: Four times a day (QID) | INTRAMUSCULAR | Status: DC | PRN

## 2017-03-13 MED ORDER — OXYTOCIN BOLUS FROM INFUSION
500.0000 mL | Freq: Once | INTRAVENOUS | Status: AC
  Administered 2017-03-13: 500 mL via INTRAVENOUS

## 2017-03-13 MED ORDER — SOD CITRATE-CITRIC ACID 500-334 MG/5ML PO SOLN: 30.0000 mL | ORAL | Status: DC | PRN

## 2017-03-13 MED ORDER — LACTATED RINGERS IV SOLN
INTRAVENOUS | Status: DC
  Administered 2017-03-13: 10:00:00 via INTRAVENOUS

## 2017-03-13 MED ORDER — LACTATED RINGERS IV SOLN: 500.0000 mL | INTRAVENOUS | Status: DC | PRN

## 2017-03-13 MED ORDER — LACTATED RINGERS IV SOLN: 500.0000 mL | Freq: Once | INTRAVENOUS | Status: DC

## 2017-03-13 MED ORDER — DIPHENHYDRAMINE HCL 25 MG PO CAPS: 25.0000 mg | ORAL_CAPSULE | Freq: Four times a day (QID) | ORAL | Status: DC | PRN

## 2017-03-13 MED ORDER — ONDANSETRON HCL 4 MG PO TABS: 4.0000 mg | ORAL_TABLET | ORAL | Status: DC | PRN

## 2017-03-13 MED ORDER — SENNOSIDES-DOCUSATE SODIUM 8.6-50 MG PO TABS
2.0000 | ORAL_TABLET | ORAL | Status: DC
  Administered 2017-03-13: 2 via ORAL
  Filled 2017-03-13: qty 2

## 2017-03-13 MED ORDER — OXYTOCIN 40 UNITS IN LACTATED RINGERS INFUSION - SIMPLE MED
2.5000 [IU]/h | INTRAVENOUS | Status: DC
  Filled 2017-03-13: qty 1000

## 2017-03-13 MED ORDER — FENTANYL 2.5 MCG/ML BUPIVACAINE 1/10 % EPIDURAL INFUSION (WH - ANES)
14.0000 mL/h | INTRAMUSCULAR | Status: DC | PRN
  Administered 2017-03-13: 14 mL/h via EPIDURAL
  Filled 2017-03-13: qty 100

## 2017-03-13 MED ORDER — ZOLPIDEM TARTRATE 5 MG PO TABS: 5.0000 mg | ORAL_TABLET | Freq: Every evening | ORAL | Status: DC | PRN

## 2017-03-13 MED ORDER — OXYCODONE-ACETAMINOPHEN 5-325 MG PO TABS
2.0000 | ORAL_TABLET | ORAL | Status: DC | PRN
Start: 2017-03-13 — End: 2017-03-14

## 2017-03-13 MED ORDER — TERBUTALINE SULFATE 1 MG/ML IJ SOLN
0.2500 mg | Freq: Once | INTRAMUSCULAR | Status: DC | PRN
  Filled 2017-03-13: qty 1

## 2017-03-13 MED ORDER — BENZOCAINE-MENTHOL 20-0.5 % EX AERO: 1.0000 "application " | INHALATION_SPRAY | CUTANEOUS | Status: DC | PRN

## 2017-03-13 MED ORDER — DIPHENHYDRAMINE HCL 50 MG/ML IJ SOLN: 12.5000 mg | INTRAMUSCULAR | Status: DC | PRN

## 2017-03-13 MED ORDER — DIBUCAINE 1 % RE OINT: 1.0000 "application " | TOPICAL_OINTMENT | RECTAL | Status: DC | PRN

## 2017-03-13 NOTE — Anesthesia Procedure Notes (Signed)
Epidural Patient location during procedure: OB  Staffing Anesthesiologist: Armanie Martine Performed: anesthesiologist   Preanesthetic Checklist Completed: patient identified, site marked, surgical consent, pre-op evaluation, timeout performed, IV checked, risks and benefits discussed and monitors and equipment checked  Epidural Patient position: sitting Prep: DuraPrep Patient monitoring: heart rate, continuous pulse ox and blood pressure Approach: right paramedian Location: L3-L4 Injection technique: LOR saline  Needle:  Needle type: Tuohy  Needle gauge: 17 G Needle length: 9 cm and 9 Needle insertion depth: 6 cm Catheter type: closed end flexible Catheter size: 20 Guage Catheter at skin depth: 10 cm Test dose: negative  Assessment Events: blood not aspirated, injection not painful, no injection resistance, negative IV test and no paresthesia  Additional Notes Patient identified. Risks/Benefits/Options discussed with patient including but not limited to bleeding, infection, nerve damage, paralysis, failed block, incomplete pain control, headache, blood pressure changes, nausea, vomiting, reactions to medication both or allergic, itching and postpartum back pain. Confirmed with bedside nurse the patient's most recent platelet count. Confirmed with patient that they are not currently taking any anticoagulation, have any bleeding history or any family history of bleeding disorders. Patient expressed understanding and wished to proceed. All questions were answered. Sterile technique was used throughout the entire procedure. Please see nursing notes for vital signs. Test dose was given through epidural needle and negative prior to continuing to dose epidural or start infusion. Warning signs of high block given to the patient including shortness of breath, tingling/numbness in hands, complete motor block, or any concerning symptoms with instructions to call for help. Patient was given  instructions on fall risk and not to get out of bed. All questions and concerns addressed with instructions to call with any issues.     

## 2017-03-13 NOTE — Progress Notes (Signed)
Philip AspenKatherine Equihua is a 26 y.o. G3P1011 at 1037w1d admitted for active labor  Subjective: Comfortable now that she has her epidural. Counseled on AROM. Agreeable.   Objective: BP 110/64   Pulse 69   Temp 98.6 F (37 C) (Oral)   Resp 18   Ht 5\' 6"  (1.676 m)   Wt 156 lb (70.8 kg)   LMP 06/12/2016 (Exact Date)   BMI 25.18 kg/m  No intake/output data recorded. No intake/output data recorded.  FHT:  FHR: 135 bpm, variability: moderate,  accelerations:  Present,  decelerations:  Present variables UC:   regular, every 3-4 mins SVE:   Dilation: 8 Effacement (%): 90 Station: 0 Exam by:: phillips  Labs: Lab Results  Component Value Date   WBC 9.1 03/13/2017   HGB 12.7 03/13/2017   HCT 38.0 03/13/2017   MCV 99.0 03/13/2017   PLT 251 03/13/2017    Assessment / Plan: Augmentation of labor, progressing well  Labor: Active, AROM now Preeclampsia:  NA Fetal Wellbeing:  Category I Pain Control:  Epidural I/D:  n/a Anticipated MOD:  NSVD  Gwenevere AbbotNimeka Trinnity Breunig 03/13/2017, 11:31 AM

## 2017-03-13 NOTE — Anesthesia Postprocedure Evaluation (Signed)
Anesthesia Post Note  Patient: Alicia AspenKatherine Wang  Procedure(s) Performed: * No procedures listed *  Patient location during evaluation: Mother Baby Anesthesia Type: Epidural Level of consciousness: awake Pain management: pain level controlled Vital Signs Assessment: post-procedure vital signs reviewed and stable Respiratory status: spontaneous breathing Cardiovascular status: stable Postop Assessment: no headache, no backache, epidural receding, patient able to bend at knees, no signs of nausea or vomiting and adequate PO intake Anesthetic complications: no        Last Vitals:  Vitals:   03/13/17 1345 03/13/17 1450  BP: 110/60 (!) 108/53  Pulse: 70 72  Resp: 20 20  Temp: 37 C     Last Pain:  Vitals:   03/13/17 1345  TempSrc: Oral  PainSc:    Pain Goal:                 Alicia Wang

## 2017-03-13 NOTE — Anesthesia Preprocedure Evaluation (Signed)

## 2017-03-13 NOTE — H&P (Signed)
LABOR AND DELIVERY ADMISSION HISTORY AND PHYSICAL NOTE  Alicia Wang is a 26 y.o. female G3P1011 with IUP at 3773w1d by LMP presenting for SOL. She reports contractions started last night at 9pm. Have become more frequent and more intense.   She reports positive fetal movement. She denies leakage of fluid or vaginal bleeding.  Prenatal History/Complications: None  Past Medical History: Past Medical History:  Diagnosis Date  . Anemia     Past Surgical History: Past Surgical History:  Procedure Laterality Date  . CESAREAN SECTION  11/20/2011   Procedure: CESAREAN SECTION;  Surgeon: Roseanna RainbowLisa A Jackson-Moore, MD;  Location: [facility];  Service: Gynecology;  Laterality: N/A;  primary cesarean section of baby girl  at  2349  APGAR 9/9  . DILATION AND CURETTAGE OF UTERUS      Obstetrical History: OB History    Gravida Para Term Preterm AB Living   3 1 1  0 1 1   SAB TAB Ectopic Multiple Live Births   1 0 0 0 1      Social History: Social History   Social History  . Marital status: Single    Spouse name: N/A  . Number of children: N/A  . Years of education: N/A   Social History Main Topics  . Smoking status: Former Games developermoker  . Smokeless tobacco: Never Used  . Alcohol use No  . Drug use: No  . Sexual activity: Yes   Other Topics Concern  . None   Social History Narrative  . None    Family History: No family history on file.  Allergies: Allergies  Allergen Reactions  . Latex Itching and Rash    Prescriptions Prior to Admission  Medication Sig Dispense Refill Last Dose  . acetaminophen (TYLENOL) 500 MG tablet Take 500 mg by mouth every 6 (six) hours as needed (for pain.).   Past Week at Unknown time  . Elastic Bandages & Supports (COMFORT FIT MATERNITY SUPP SM) MISC 1 Units by Does not apply route daily as needed. 1 each 0   . famotidine (PEPCID) 40 MG tablet Take 1 tablet (40 mg total) by mouth daily. 30 tablet 3   . Prenatal Vit-Fe Fumarate-FA (PRENATAL  MULTIVITAMIN) TABS tablet Take 1 tablet by mouth daily at 12 noon. 30 tablet 12 10/25/2016 at Unknown time  . promethazine (PHENERGAN) 25 MG tablet Take 0.5-1 tablets (12.5-25 mg total) by mouth every 6 (six) hours as needed. 30 tablet 0      Review of Systems   All systems reviewed and negative except as stated in HPI  Blood pressure 117/79, pulse 72, resp. rate (!) 98, height 5\' 6"  (1.676 m), last menstrual period 06/12/2016, unknown if currently breastfeeding. General appearance: alert, cooperative and mild distress Lungs: clear to auscultation bilaterally Heart: regular rate and rhythm Abdomen: soft, non-tender; bowel sounds normal Extremities: No calf swelling or tenderness Presentation: cephalic Fetal monitoring: cat 1 strip Uterine activity: q3-824min contractions Dilation: 5 Effacement (%): 80 Station: -2 Exam by:: Vira BlancoShimica Robinson RN    Prenatal labs: ABO, Rh:  O pos Antibody:  neg Rubella: Immune RPR: Non Reactive (08/13 0206)  HBsAg:   Neg HIV: Non Reactive (08/13 0206)  GBS:   Neg 1 hr Glucola: 80 Genetic screening:  neg Anatomy US: normal  Prenatal Transfer Tool  Maternal Diabetes: No Genetic Screening: Normal Maternal Ultrasounds/Referrals: NA Fetal Ultrasounds or other Referrals:  None Maternal Substance Abuse:  Yes:  Type: Smoker Significant Maternal Medications:  None Significant Maternal Lab Results: None  No results found for this or any previous visit (from the past 24 hour(s)).  Patient Active Problem List   Diagnosis Date Noted  . Previous cesarean delivery affecting pregnancy, antepartum 09/23/2016  . Supervision of high risk pregnancy, antepartum 09/23/2016  . Cesarean delivery, without mention of indication, delivered, with or without mention of antepartum condition 11/21/2011  . Early/threatened labor 11/20/2011    Assessment: Alicia Wang is a 26 y.o. G3P1011 at [redacted]w[redacted]d here for SOL/TOLAC  #Labor: active #Pain: Desires  epidural #FWB: Cat 1 #ID: GBS neg #MOF: breast #MOC:OCPs (desires BTL, but no consent) #Circ:  no  Alicia Abbot, MD Family Medicine Resident, PGY-2 03/13/2017, 9:48 AM

## 2017-03-13 NOTE — MAU Note (Signed)
Presents to MAU  Due to with contractions all day on Sat.  Around 6 am to day contractions just got worse.  Denies LOF, vaginal bleeding, + FM. Desires TOLAC

## 2017-03-14 LAB — RPR: RPR: NONREACTIVE

## 2017-03-14 MED ORDER — IBUPROFEN 600 MG PO TABS: 600.0000 mg | ORAL_TABLET | Freq: Four times a day (QID) | ORAL | 0 refills | Status: DC

## 2017-03-14 MED ORDER — NORETHINDRONE 0.35 MG PO TABS: 1.0000 | ORAL_TABLET | Freq: Every day | ORAL | 11 refills | Status: DC

## 2017-03-14 NOTE — Lactation Note (Signed)
This note was copied from a baby's chart. Lactation Consultation Note  Experienced BF mother reports that BF is going well but that the baby is eating every hour. Reviewed with her to count feedings from the beginning of one feeding to the beginning of the next. Encouraged hand expression to aid in transfer. The total feedings for 23 HOL was 9 times.  Reviewed output for days of life and hand positioning for expression. Mother had just finished feeding the baby and was now holding him and walking around the room. She did not desire any hands on help. Directed her to discharge book for information on engorgement treatment if needed.  Patient Name: Alicia Wang ZOXWR'UToday's Date: 03/14/2017 Reason for consult: Initial assessment   Maternal Data Has patient been taught Hand Expression?: Yes (by RN) Does the patient have breastfeeding experience prior to this delivery?: Yes  Feeding Feeding Type: Breast Fed Length of feed: 30 min  LATCH Score/Interventions                      Lactation Tools Discussed/Used     Consult Status Consult Status: Follow-up Date: 03/15/17 Follow-up type: Call as needed    Soyla DryerJoseph, Starlyn Droge 03/14/2017, 11:36 AM

## 2017-03-14 NOTE — Progress Notes (Signed)
MOB was referred for history of depression/anxiety. * Referral screened out by Clinical Social Worker because none of the following criteria appear to apply: ~ History of anxiety/depression during this pregnancy, or of post-partum depression. ~ Diagnosis of anxiety and/or depression within last 3 years OR * MOB's symptoms currently being treated with medication and/or therapy.  CSW completed chart review and MOB's MH hx was in 2012. CSW educated MOB about PPD. CSW informed MOB of possible supports and interventions to decrease PPD.  CSW also encouraged MOB to seek medical attention if needed for increased signs and symptoms for PPD.  CSW provided MOB with Health Dept. CSW contact information.  MOB denied psychosocial stressors.  MOB also denied SI, HI, and DV and reported that FOB is involved.   Trexton Escamilla Boyd-Gilyard, MSW, LCSW Clinical Social Work [phone] 

## 2017-03-14 NOTE — Discharge Summary (Signed)
OB Discharge Summary  Patient Name: Alicia Wang DOB: 11/03/1991 MRN: [id number]  Date of admission: 03/13/2017 Delivering MD: Gwenevere AbbotPHILLIP, NIMEKA   Date of discharge: 03/14/2017  Admitting diagnosis: 38 WEEKS CONTRACTIONS Intrauterine pregnancy: 5762w1d     Secondary diagnosis:Active Problems:   Normal labor  Discharge diagnosis: Term Pregnancy Delivered                                                                     Post partum procedures:none   Complications: None  Hospital course:  Onset of Labor With Vaginal Delivery     26 y.o. yo [id number] at 2762w1d was admitted in Active Labor on 03/13/2017. Patient had an uncomplicated labor course as follows:  Membrane Rupture Time/Date: 11:26 AM ,03/13/2017   Intrapartum Procedures: Episiotomy: None [1]                                         Lacerations:  None [1]  Patient had a delivery of a Viable infant. 03/13/2017  Information for the patient's newborn:  Luiz Ochoaortorreal, Boy Necola [[id number]][[id number]]  Delivery Method: VBAC, Spontaneous (Filed from Delivery Summary)    [name] had an uncomplicated postpartum course.  She is ambulating, tolerating a regular diet, passing flatus, and urinating well. Patient is discharged home in stable condition on 03/14/17.   Physical exam  Vitals:   03/13/17 1450 03/13/17 1842 03/14/17 0143 03/14/17 0534  BP: (!) 108/53 102/60 (!) 114/51 (!) 107/56  Pulse: 72 67 (!) 58 (!) 55  Resp: 20 18 18 18   Temp:  98 F (36.7 C) 98.8 F (37.1 C) 98.1 F (36.7 C)  TempSrc:   Oral   Weight:      Height:       General: alert Lochia: appropriate Uterine Fundus: firm Incision: N/A DVT Evaluation: No evidence of DVT seen on physical exam. Labs: Lab Results  Component Value Date   WBC 9.1 03/13/2017   HGB 12.7 03/13/2017   HCT 38.0 03/13/2017   MCV 99.0 03/13/2017   PLT 251 03/13/2017   CMP Latest Ref Rng & Units 10/26/2016  Glucose 65 - 99 mg/dL 82  BUN 6 - 20 mg/dL 6  Creatinine 6.960.44 - 2.951.00  mg/dL 2.84(X0.42(L)  Sodium 324135 - 401145 mmol/L 134(L)  Potassium 3.5 - 5.1 mmol/L 3.4(L)  Chloride 101 - 111 mmol/L 102  CO2 22 - 32 mmol/L 25  Calcium 8.9 - 10.3 mg/dL 0.2(V8.8(L)  Total Protein 6.5 - 8.1 g/dL 6.7  Total Bilirubin 0.3 - 1.2 mg/dL 0.7  Alkaline Phos 38 - 126 U/L 44  AST 15 - 41 U/L 16  ALT 14 - 54 U/L 14    Discharge instruction: per After Visit Summary and "Baby and Me Booklet".  After Visit Meds:  Allergies as of 03/14/2017      Reactions   Latex Itching, Rash      Medication List    STOP taking these medications   promethazine 25 MG tablet Commonly known as:  PHENERGAN     TAKE these medications   acetaminophen 500 MG tablet Commonly known as:  TYLENOL Take 500-1,000 mg by mouth every 6 (  six) hours as needed for mild pain, moderate pain, fever or headache.   famotidine 40 MG tablet Commonly known as:  PEPCID Take 1 tablet (40 mg total) by mouth daily. What changed:  when to take this  reasons to take this   ibuprofen 600 MG tablet Commonly known as:  ADVIL,MOTRIN Take 1 tablet (600 mg total) by mouth every 6 (six) hours.   norethindrone 0.35 MG tablet Commonly known as:  MICRONOR,CAMILA,ERRIN Take 1 tablet (0.35 mg total) by mouth daily.   prenatal multivitamin Tabs tablet Take 1 tablet by mouth daily at 12 noon. What changed:  when to take this       Diet: routine diet  Activity: Advance as tolerated. Pelvic rest for 6 weeks.   Outpatient follow up:6 weeks Follow up Appt:No future appointments. Follow up visit: No Follow-up on file.  Postpartum contraception: Progesterone only pills  Newborn Data: Live born female  Birth Weight: 6 lb 15.5 oz (3160 g) APGAR: 9, 9  Baby Feeding: Breast Disposition:home with mother pending Peds rounding and approval   03/14/2017 Allie Bossier, MD

## 2017-03-14 NOTE — Discharge Instructions (Signed)

## 2017-03-14 NOTE — Progress Notes (Signed)
Check on patient needs,by Orlan LeavensViria Alvarez Spanish Interpreter.

## 2018-02-05 ENCOUNTER — Encounter (HOSPITAL_COMMUNITY): Payer: Self-pay

## 2018-02-05 ENCOUNTER — Emergency Department (HOSPITAL_COMMUNITY): Payer: Medicaid Other

## 2018-02-05 ENCOUNTER — Emergency Department (HOSPITAL_COMMUNITY)
Admission: EM | Admit: 2018-02-05 | Discharge: 2018-02-05 | Disposition: A | Discharge status: Home / Self Care | Payer: Medicaid Other | Attending: Emergency Medicine | Admitting: Emergency Medicine

## 2018-02-05 ENCOUNTER — Other Ambulatory Visit: Payer: Self-pay

## 2018-02-05 DIAGNOSIS — Z79899 Other long term (current) drug therapy: Secondary | ICD-10-CM | POA: Insufficient documentation

## 2018-02-05 DIAGNOSIS — F17210 Nicotine dependence, cigarettes, uncomplicated: Secondary | ICD-10-CM | POA: Insufficient documentation

## 2018-02-05 DIAGNOSIS — R51 Headache: Secondary | ICD-10-CM | POA: Diagnosis not present

## 2018-02-05 DIAGNOSIS — J029 Acute pharyngitis, unspecified: Secondary | ICD-10-CM | POA: Insufficient documentation

## 2018-02-05 DIAGNOSIS — R197 Diarrhea, unspecified: Secondary | ICD-10-CM | POA: Diagnosis not present

## 2018-02-05 DIAGNOSIS — R509 Fever, unspecified: Secondary | ICD-10-CM | POA: Diagnosis not present

## 2018-02-05 DIAGNOSIS — R5383 Other fatigue: Secondary | ICD-10-CM | POA: Diagnosis not present

## 2018-02-05 DIAGNOSIS — R05 Cough: Secondary | ICD-10-CM | POA: Diagnosis present

## 2018-02-05 DIAGNOSIS — J069 Acute upper respiratory infection, unspecified: Secondary | ICD-10-CM | POA: Insufficient documentation

## 2018-02-05 DIAGNOSIS — B9789 Other viral agents as the cause of diseases classified elsewhere: Secondary | ICD-10-CM

## 2018-02-05 DIAGNOSIS — J3489 Other specified disorders of nose and nasal sinuses: Secondary | ICD-10-CM | POA: Insufficient documentation

## 2018-02-05 HISTORY — DX: Respiratory tuberculosis unspecified: A15.9

## 2018-02-05 LAB — COMPREHENSIVE METABOLIC PANEL
ALK PHOS: 51 U/L (ref 38–126)
ALT: 59 U/L — AB (ref 14–54)
AST: 53 U/L — AB (ref 15–41)
Albumin: 3.7 g/dL (ref 3.5–5.0)
Anion gap: 11 (ref 5–15)
BUN: 7 mg/dL (ref 6–20)
CALCIUM: 8.9 mg/dL (ref 8.9–10.3)
CHLORIDE: 107 mmol/L (ref 101–111)
CO2: 24 mmol/L (ref 22–32)
Creatinine, Ser: 0.79 mg/dL (ref 0.44–1.00)
GFR calc Af Amer: >60 mL/min (ref >60)
Glucose, Bld: 96 mg/dL (ref 65–99)
Potassium: 3.7 mmol/L (ref 3.5–5.1)
SODIUM: 142 mmol/L (ref 135–145)
Total Bilirubin: 1 mg/dL (ref 0.3–1.2)
Total Protein: 7 g/dL (ref 6.5–8.1)

## 2018-02-05 LAB — URINALYSIS, ROUTINE W REFLEX MICROSCOPIC
Bacteria, UA: NONE SEEN
Bilirubin Urine: NEGATIVE
Glucose, UA: NEGATIVE mg/dL
Ketones, ur: NEGATIVE mg/dL
Leukocytes, UA: NEGATIVE
Nitrite: NEGATIVE
Protein, ur: 30 mg/dL — AB
SPECIFIC GRAVITY, URINE: 1.034 — AB (ref 1.005–1.030)
pH: 6 (ref 5.0–8.0)

## 2018-02-05 LAB — I-STAT BETA HCG BLOOD, ED (MC, WL, AP ONLY): I-stat hCG, quantitative: <5 m[IU]/mL (ref <5)

## 2018-02-05 LAB — INFLUENZA PANEL BY PCR (TYPE A & B)
INFLAPCR: NEGATIVE
INFLBPCR: NEGATIVE

## 2018-02-05 LAB — CBC
HCT: 39.1 % (ref 36.0–46.0)
Hemoglobin: 12.7 g/dL (ref 12.0–15.0)
MCH: 29.6 pg (ref 26.0–34.0)
MCHC: 32.5 g/dL (ref 30.0–36.0)
MCV: 91.1 fL (ref 78.0–100.0)
PLATELETS: 287 10*3/uL (ref 150–400)
RBC: 4.29 MIL/uL (ref 3.87–5.11)
RDW: 12.7 % (ref 11.5–15.5)
WBC: 4.9 10*3/uL (ref 4.0–10.5)

## 2018-02-05 LAB — LIPASE, BLOOD: LIPASE: 21 U/L (ref 11–51)

## 2018-02-05 MED ORDER — PSEUDOEPHEDRINE HCL 30 MG PO TABS: 30.0000 mg | ORAL_TABLET | Freq: Four times a day (QID) | ORAL | 0 refills | Status: DC | PRN

## 2018-02-05 MED ORDER — BENZONATATE 100 MG PO CAPS: 100.0000 mg | ORAL_CAPSULE | Freq: Three times a day (TID) | ORAL | 0 refills | Status: DC

## 2018-02-05 MED ORDER — GUAIFENESIN-CODEINE 100-10 MG/5ML PO SYRP: 5.0000 mL | ORAL_SOLUTION | Freq: Every evening | ORAL | 0 refills | Status: DC | PRN

## 2018-02-05 NOTE — ED Triage Notes (Signed)
Pt states she has had cough for 1 week and has been feeling unwell. Pt states she initially had the flu but now has diarrhea and hemoptysis. 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

## 2018-02-05 NOTE — ED Provider Notes (Signed)
[facility] EMERGENCY DEPARTMENT Provider Note   CSN: [id number] Arrival date & time: 02/05/18  1612     History   Chief Complaint Chief Complaint  Patient presents with  . Cough  . Diarrhea    HPI Alicia Wang is a 27 y.o. female who presents to the ED with a cough that started one week ago with fever and chills followed by sore throat and aching all over. The symptoms started getting better then yesterday symptoms started again and now diarrhea that started.  The history is provided by the patient. No language interpreter was used.  Diarrhea   Associated symptoms include chills, headaches, myalgias and cough.  Influenza  Presenting symptoms: cough, diarrhea, fatigue, fever, headache, myalgias, rhinorrhea and sore throat   Severity:  Moderate Onset quality:  Gradual Duration:  1 day Progression:  Worsening Chronicity:  New Relieved by:  Nothing Worsened by:  Nothing Ineffective treatments:  Hot fluids and OTC medications Associated symptoms: chills     Past Medical History:  Diagnosis Date  . Anemia   . Tuberculosis     Patient Active Problem List   Diagnosis Date Noted  . Normal labor 03/13/2017  . Previous cesarean delivery affecting pregnancy, antepartum 09/23/2016  . Supervision of high risk pregnancy, antepartum 09/23/2016  . Cesarean delivery, without mention of indication, delivered, with or without mention of antepartum condition 11/21/2011  . Early/threatened labor 11/20/2011    Past Surgical History:  Procedure Laterality Date  . CESAREAN SECTION  11/20/2011   Procedure: CESAREAN SECTION;  Surgeon: [name], MD;  Location: [facility];  Service: Gynecology;  Laterality: N/A;  primary cesarean section of baby girl  at  2349  APGAR 9/9  . DILATION AND CURETTAGE OF UTERUS      OB History    Gravida Para Term Preterm AB Living   3 2 2  0 1 2   SAB TAB Ectopic Multiple Live Births   1 0 0 0 2       Home  Medications    Prior to Admission medications   Medication Sig Start Date End Date Taking? Authorizing Provider  acetaminophen (TYLENOL) 500 MG tablet Take 500-1,000 mg by mouth every 6 (six) hours as needed for mild pain, moderate pain, fever or headache.     [provider]  benzonatate (TESSALON) 100 MG capsule Take 1 capsule (100 mg total) by mouth every 8 (eight) hours. 02/05/18   Janne Napoleon, NP  famotidine (PEPCID) 40 MG tablet Take 1 tablet (40 mg total) by mouth daily. Patient taking differently: Take 40 mg by mouth daily as needed for heartburn or indigestion.  10/26/16   Thressa Sheller D, CNM  guaiFENesin-codeine (ROBITUSSIN AC) 100-10 MG/5ML syrup Take 5 mLs by mouth at bedtime as needed for cough. 02/05/18   Janne Napoleon, NP  ibuprofen (ADVIL,MOTRIN) 600 MG tablet Take 1 tablet (600 mg total) by mouth every 6 (six) hours. 03/14/17   Allie Bossier, MD  norethindrone (MICRONOR,CAMILA,ERRIN) 0.35 MG tablet Take 1 tablet (0.35 mg total) by mouth daily. 03/14/17   Allie Bossier, MD  Prenatal Vit-Fe Fumarate-FA (PRENATAL MULTIVITAMIN) TABS tablet Take 1 tablet by mouth daily at 12 noon. Patient taking differently: Take 1 tablet by mouth at bedtime.  03/15/14   Valarie Cones, Dema Severin, PA-C  pseudoephedrine (SUDAFED) 30 MG tablet Take 1 tablet (30 mg total) by mouth every 6 (six) hours as needed for congestion. 02/05/18   Janne Napoleon, NP  Family History History reviewed. No pertinent family history.  Social History Social History   Tobacco Use  . Smoking status: Current Every Day Smoker    Packs/day: 0.25    Years: 5.00    Pack years: 1.25    Types: Cigarettes  . Smokeless tobacco: Never Used  Substance Use Topics  . Alcohol use: No  . Drug use: No     Allergies   Latex   Review of Systems Review of Systems  Constitutional: Positive for chills, fatigue and fever.  HENT: Positive for rhinorrhea and sore throat.   Respiratory: Positive for cough.   Gastrointestinal:  Positive for diarrhea.  Genitourinary: Negative for difficulty urinating, dysuria, frequency and urgency.  Musculoskeletal: Positive for myalgias.  Skin: Negative for rash.  Neurological: Positive for light-headedness and headaches.  Psychiatric/Behavioral: Negative for confusion.     Physical Exam Updated Vital Signs BP 103/69   Pulse (!) 53   Temp 98.6 F (37 C) (Oral)   Resp 18   LMP 02/01/2018   SpO2 97%   Physical Exam  Constitutional: She is oriented to person, place, and time. She appears well-developed and well-nourished. No distress.  HENT:  Head: Normocephalic.  Right Ear: Tympanic membrane normal.  Left Ear: Tympanic membrane normal.  Nose: Mucosal edema and rhinorrhea present.  Mouth/Throat: Uvula is midline and mucous membranes are normal. Posterior oropharyngeal erythema present. No posterior oropharyngeal edema.  Eyes: Conjunctivae and EOM are normal. Pupils are equal, round, and reactive to light.  Neck: Neck supple.  Cardiovascular: Normal rate and regular rhythm.  Pulmonary/Chest: Effort normal.  Abdominal: Soft. Bowel sounds are normal. There is no tenderness.  Musculoskeletal: Normal range of motion.  Neurological: She is alert and oriented to person, place, and time. No cranial nerve deficit.  Skin: Skin is warm and dry.  Psychiatric: She has a normal mood and affect. Her behavior is normal.  Nursing note and vitals reviewed.    ED Treatments / Results  Labs (all labs ordered are listed, but only abnormal results are displayed) Labs Reviewed  COMPREHENSIVE METABOLIC PANEL - Abnormal; Notable for the following components:      Result Value   AST 53 (*)    ALT 59 (*)    All other components within normal limits  URINALYSIS, ROUTINE W REFLEX MICROSCOPIC - Abnormal; Notable for the following components:   Color, Urine AMBER (*)    APPearance HAZY (*)    Specific Gravity, Urine 1.034 (*)    Hgb urine dipstick SMALL (*)    Protein, ur 30 (*)     Squamous Epithelial / LPF 0-5 (*)    All other components within normal limits  LIPASE, BLOOD  CBC  INFLUENZA PANEL BY PCR (TYPE A & B)  I-STAT BETA HCG BLOOD, ED ([facility], [facility], [facility] ONLY)   Radiology Dg Chest 2 View  Result Date: 02/05/2018 CLINICAL DATA:  Fever.  Shortness of breath. EXAM: CHEST  2 VIEW COMPARISON:  None. FINDINGS: The heart size and mediastinal contours are within normal limits. Both lungs are clear. The visualized skeletal structures are unremarkable. IMPRESSION: No active cardiopulmonary disease. Electronically Signed   By: Gerome Samavid  Williams III M.D   On: 02/05/2018 17:43    Procedures Procedures (including critical care time)  Medications Ordered in ED Medications - No data to display   Initial Impression / Assessment and Plan / ED Course  I have reviewed the triage vital signs and the nursing notes.  Pt CXR negative for acute infiltrate.  Patients symptoms are consistent with URI, likely viral etiology. Discussed that antibiotics are not indicated for viral infections. Pt will be discharged with symptomatic treatment.  Verbalizes understanding and is agreeable with plan. Pt is hemodynamically stable & in NAD prior to dc.  Final Clinical Impressions(s) / ED Diagnoses   Final diagnoses:  Viral URI with cough    ED Discharge Orders        Ordered    benzonatate (TESSALON) 100 MG capsule  Every 8 hours     02/05/18 2056    guaiFENesin-codeine (ROBITUSSIN AC) 100-10 MG/5ML syrup  At bedtime PRN     02/05/18 2056    pseudoephedrine (SUDAFED) 30 MG tablet  Every 6 hours PRN     02/05/18 2056       Kerrie Buffalo Maynard, NP 02/05/18 2144    Benjiman Core, MD 02/05/18 2238

## 2018-02-05 NOTE — Discharge Instructions (Signed)
Take the medication as directed. Follow up with your doctor. Return here as needed. °

## 2018-02-25 ENCOUNTER — Other Ambulatory Visit: Payer: Self-pay

## 2018-02-25 ENCOUNTER — Emergency Department (HOSPITAL_COMMUNITY): Payer: Medicaid Other

## 2018-02-25 ENCOUNTER — Encounter (HOSPITAL_COMMUNITY): Payer: Self-pay | Admitting: Emergency Medicine

## 2018-02-25 ENCOUNTER — Emergency Department (HOSPITAL_COMMUNITY)
Admission: EM | Admit: 2018-02-25 | Discharge: 2018-02-25 | Disposition: A | Discharge status: Home / Self Care | Payer: Medicaid Other | Attending: Emergency Medicine | Admitting: Emergency Medicine

## 2018-02-25 DIAGNOSIS — S0083XA Contusion of other part of head, initial encounter: Secondary | ICD-10-CM | POA: Diagnosis not present

## 2018-02-25 DIAGNOSIS — Y929 Unspecified place or not applicable: Secondary | ICD-10-CM | POA: Diagnosis not present

## 2018-02-25 DIAGNOSIS — F17210 Nicotine dependence, cigarettes, uncomplicated: Secondary | ICD-10-CM | POA: Diagnosis not present

## 2018-02-25 DIAGNOSIS — S0990XA Unspecified injury of head, initial encounter: Secondary | ICD-10-CM | POA: Diagnosis present

## 2018-02-25 DIAGNOSIS — Y999 Unspecified external cause status: Secondary | ICD-10-CM | POA: Insufficient documentation

## 2018-02-25 DIAGNOSIS — S025XXA Fracture of tooth (traumatic), initial encounter for closed fracture: Secondary | ICD-10-CM

## 2018-02-25 DIAGNOSIS — R079 Chest pain, unspecified: Secondary | ICD-10-CM | POA: Insufficient documentation

## 2018-02-25 DIAGNOSIS — M79605 Pain in left leg: Secondary | ICD-10-CM | POA: Diagnosis not present

## 2018-02-25 DIAGNOSIS — M545 Low back pain: Secondary | ICD-10-CM | POA: Insufficient documentation

## 2018-02-25 DIAGNOSIS — Y939 Activity, unspecified: Secondary | ICD-10-CM | POA: Diagnosis not present

## 2018-02-25 DIAGNOSIS — N644 Mastodynia: Secondary | ICD-10-CM | POA: Diagnosis not present

## 2018-02-25 LAB — POC URINE PREG, ED: Preg Test, Ur: NEGATIVE

## 2018-02-25 MED ORDER — TRAMADOL HCL 50 MG PO TABS: 50.0000 mg | ORAL_TABLET | Freq: Four times a day (QID) | ORAL | 0 refills | Status: DC | PRN

## 2018-02-25 MED ORDER — OXYCODONE-ACETAMINOPHEN 5-325 MG PO TABS
1.0000 | ORAL_TABLET | Freq: Once | ORAL | Status: AC
  Administered 2018-02-25: 1 via ORAL
  Filled 2018-02-25: qty 1

## 2018-02-25 MED ORDER — CYCLOBENZAPRINE HCL 10 MG PO TABS: 10.0000 mg | ORAL_TABLET | Freq: Three times a day (TID) | ORAL | 0 refills | Status: DC | PRN

## 2018-02-25 MED ORDER — AMOXICILLIN 500 MG PO CAPS: 500.0000 mg | ORAL_CAPSULE | Freq: Three times a day (TID) | ORAL | 0 refills | Status: DC
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
## 2018-02-25 NOTE — ED Notes (Signed)
Patient transported to CT 

## 2018-03-09 IMAGING — US US OB TRANSVAGINAL
1 series · 13 of 26 positions shown · non-contrast
Comparison: None.

CLINICAL DATA: Acute onset of left lower quadrant abdominal pain.
Initial encounter.

EXAM:
OBSTETRIC <14 WK US AND TRANSVAGINAL OB US
TECHNIQUE: Both transabdominal and transvaginal ultrasound examinations were
performed for complete evaluation of the gestation as well as the
maternal uterus, adnexal regions, and pelvic cul-de-sac.
Transvaginal technique was performed to assess early pregnancy.

[Series 1: us ob transvaginal · 0.17mm/px · 13 of 26 slices shown]
[im 2/26]
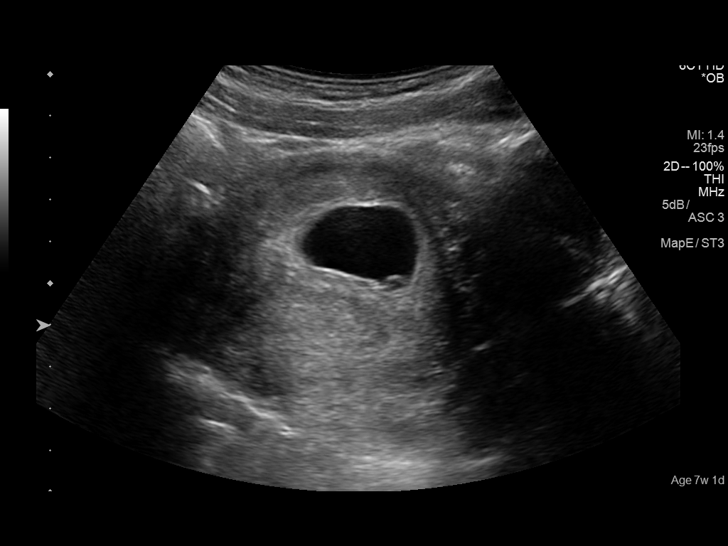
[im 4/26]
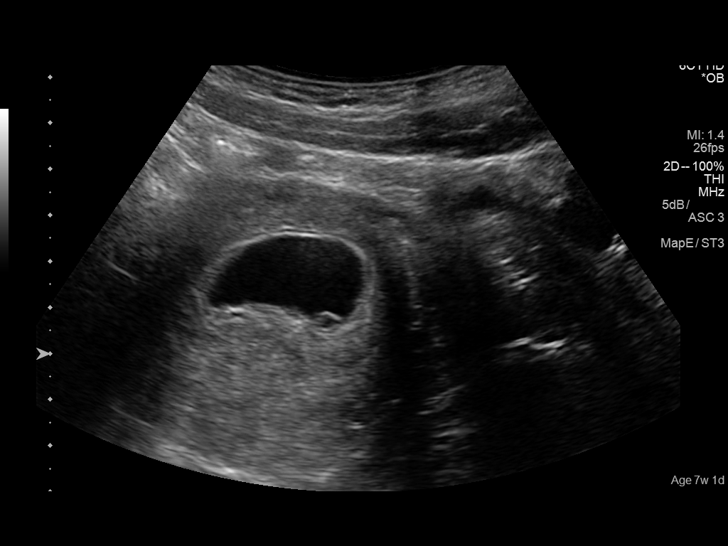
[im 6/26]
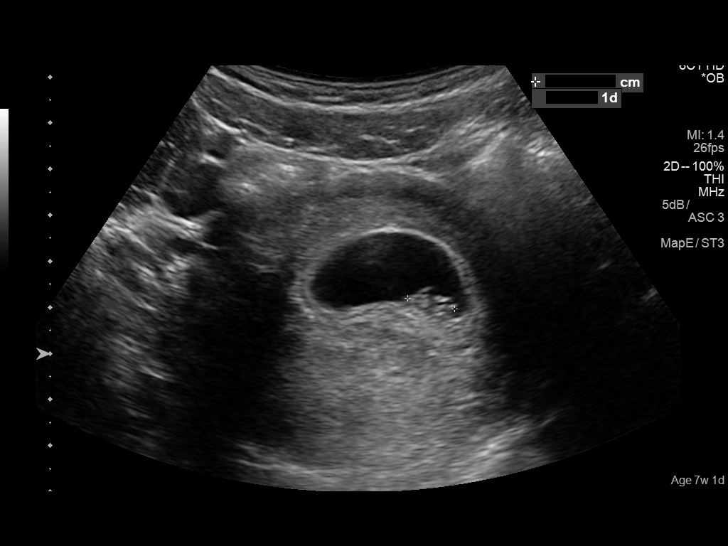
[im 8/26]
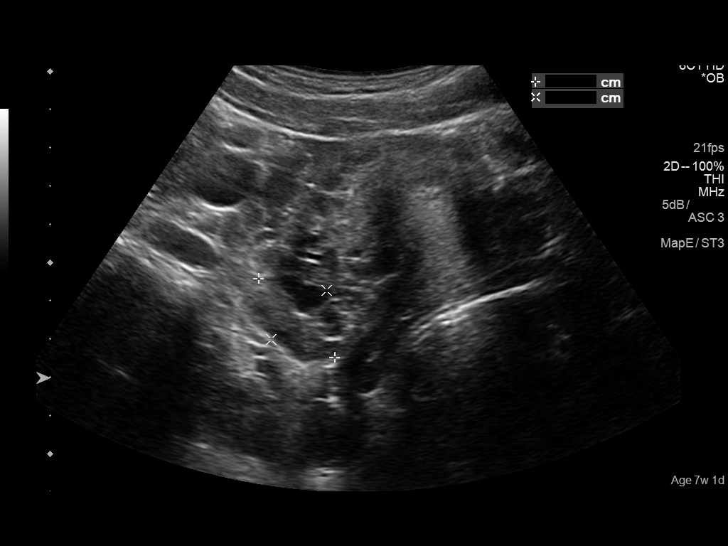
[im 10/26]
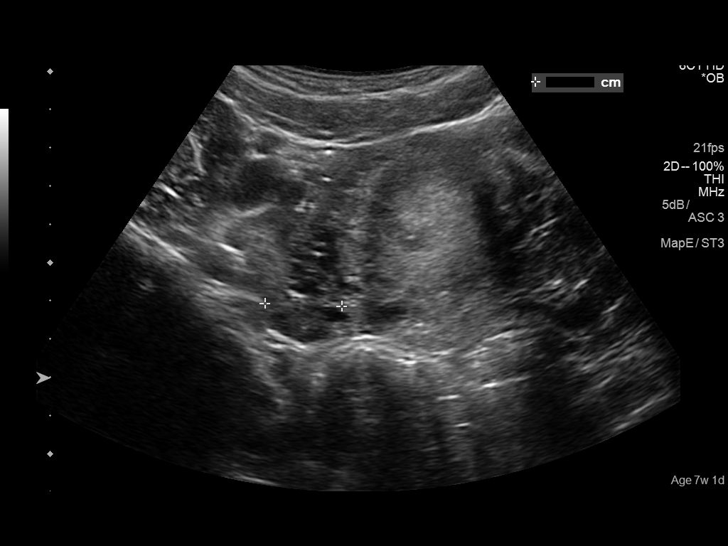
[im 12/26]
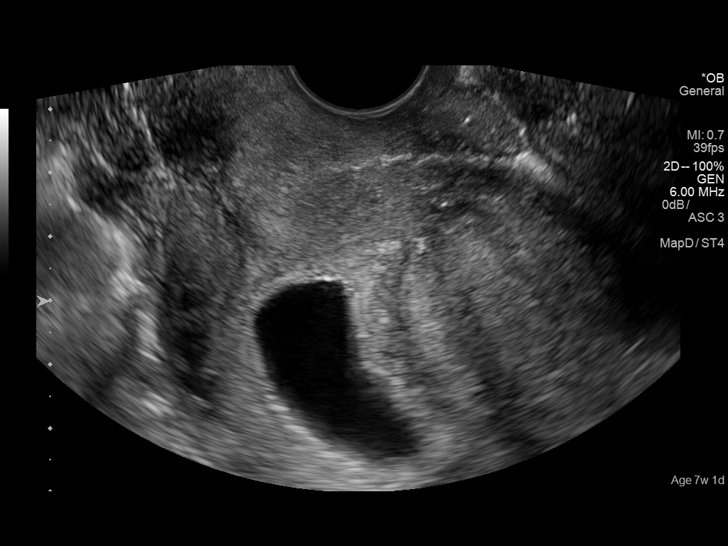
[im 14/26]
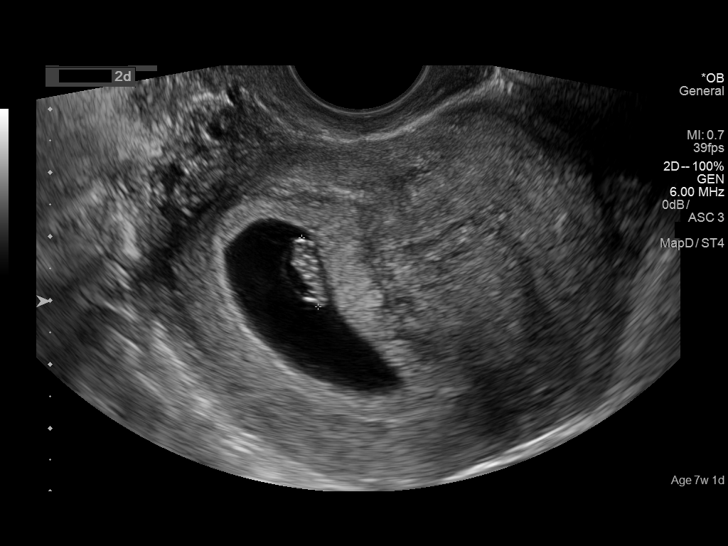
[im 16/26]
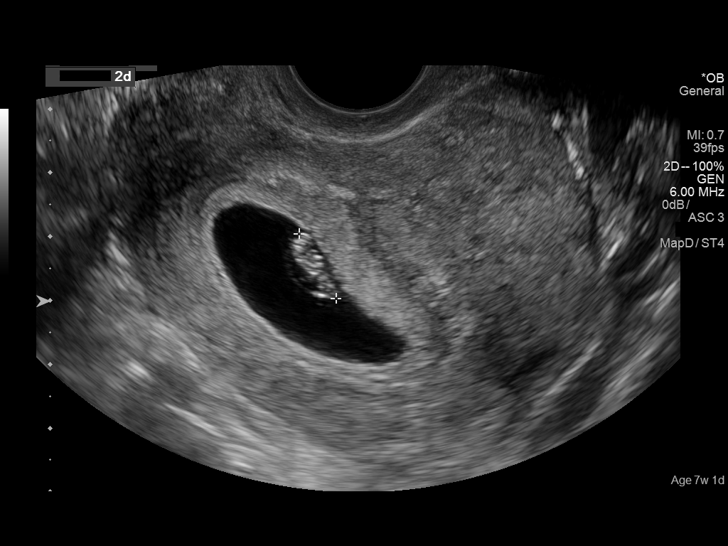
[im 18/26]
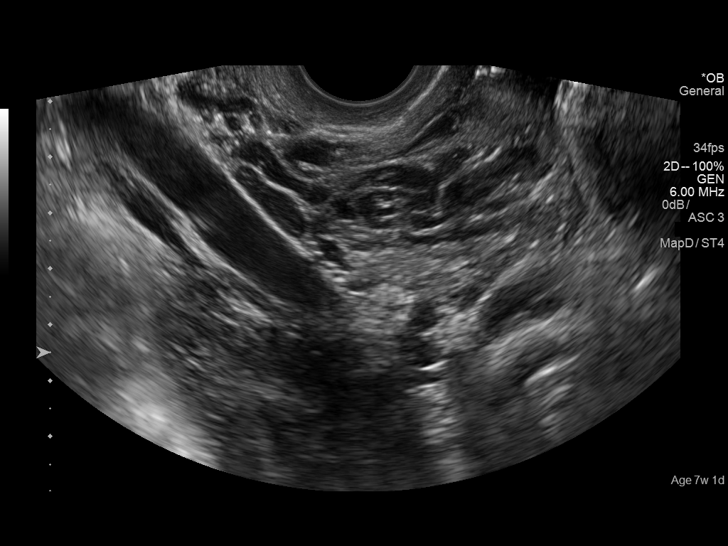
[im 20/26]
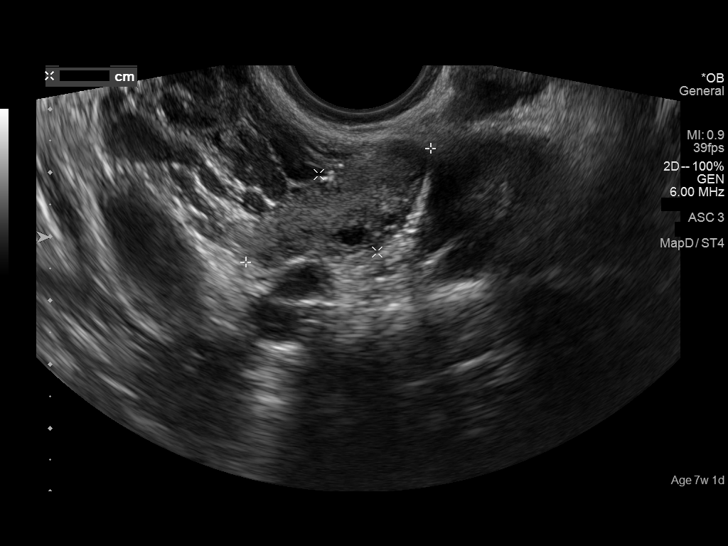
[im 22/26]
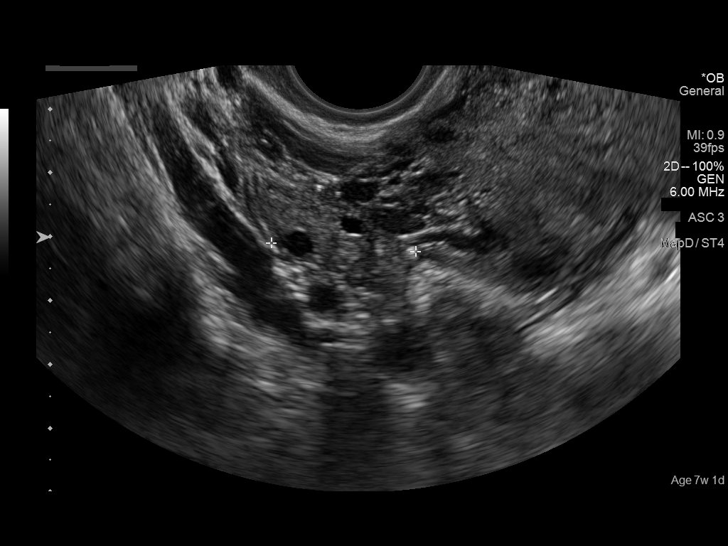
[im 24/26]
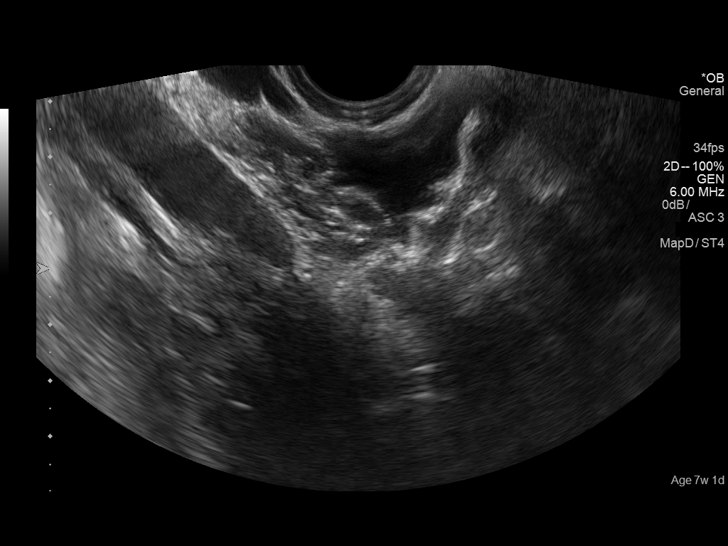
[im 26/26]
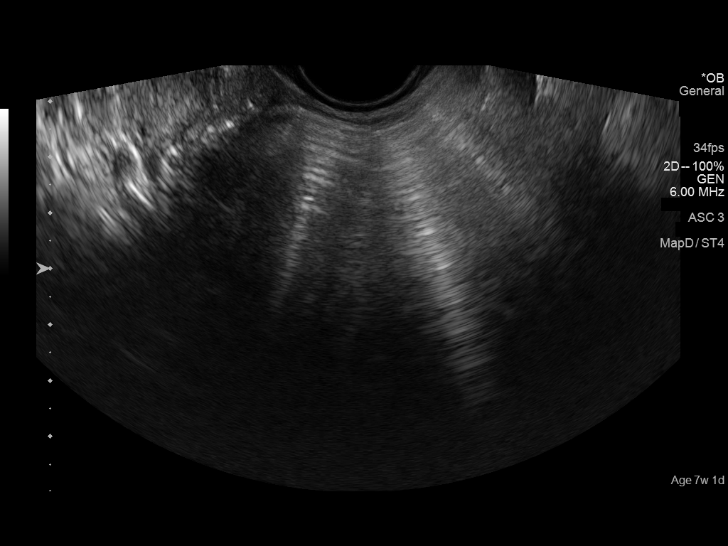

[13 of 26 positions shown; findings below may reference images not displayed]

FINDINGS: Intrauterine gestational sac: Single; visualized and normal in
shape.

Yolk sac:  Yes

Embryo:  Yes

Cardiac Activity: Yes

Heart Rate: 141  bpm

CRL:  1.1 cm   7 w   2 d                  US EDC: 03/18/2017

Subchorionic hemorrhage: A small amount of subchorionic hemorrhage
is noted.

Maternal uterus/adnexae: The uterus is otherwise unremarkable.

The right ovary measures 3.4 x 1.5 x 2.3 cm, while the left ovary is
not visualized. There is no evidence for ovarian torsion. No
suspicious adnexal masses are seen.

A small amount of free fluid is seen within the pelvic cul-de-sac.
IMPRESSION: 1. Single live intrauterine pregnancy noted, with a crown-rump
length of 1.1 cm, corresponding to a gestational age of 7 weeks 2
days. This matches the gestational age of 7 weeks 1 day by LMP,
reflecting an estimated date of delivery March 19, 2017.
2. Small amount of subchorionic hemorrhage noted.

## 2018-05-22 ENCOUNTER — Emergency Department (HOSPITAL_COMMUNITY)
Admission: EM | Admit: 2018-05-22 | Discharge: 2018-05-22 | Disposition: A | Discharge status: Home / Self Care | Payer: Medicaid Other | Attending: Emergency Medicine | Admitting: Emergency Medicine

## 2018-05-22 ENCOUNTER — Encounter (HOSPITAL_COMMUNITY): Payer: Self-pay | Admitting: Emergency Medicine

## 2018-05-22 DIAGNOSIS — Z91040 Latex allergy status: Secondary | ICD-10-CM | POA: Diagnosis not present

## 2018-05-22 DIAGNOSIS — R0981 Nasal congestion: Secondary | ICD-10-CM | POA: Diagnosis present

## 2018-05-22 DIAGNOSIS — J0100 Acute maxillary sinusitis, unspecified: Secondary | ICD-10-CM

## 2018-05-22 DIAGNOSIS — L853 Xerosis cutis: Secondary | ICD-10-CM | POA: Insufficient documentation

## 2018-05-22 DIAGNOSIS — F17210 Nicotine dependence, cigarettes, uncomplicated: Secondary | ICD-10-CM | POA: Diagnosis not present

## 2018-05-22 DIAGNOSIS — Z79899 Other long term (current) drug therapy: Secondary | ICD-10-CM | POA: Insufficient documentation

## 2018-05-22 MED ORDER — OXYMETAZOLINE HCL 0.05 % NA SOLN: 1.0000 | Freq: Two times a day (BID) | NASAL | 0 refills | Status: AC

## 2018-05-22 MED ORDER — AMOXICILLIN-POT CLAVULANATE 875-125 MG PO TABS: 1.0000 | ORAL_TABLET | Freq: Two times a day (BID) | ORAL | 0 refills | Status: AC

## 2018-05-22 MED ORDER — FLUTICASONE PROPIONATE 50 MCG/ACT NA SUSP: 1.0000 | Freq: Two times a day (BID) | NASAL | 0 refills | Status: DC

## 2018-05-22 NOTE — ED Triage Notes (Signed)
Patient to ED for continued head and sinus pressure x 1 month. Denies fevers/chills. Taking Tylenol with some relief.

## 2018-05-22 NOTE — Discharge Instructions (Signed)
Toma antibioticos.  [place] flonase cada dia hasta que los sintomas mejoran.  [place] Afrin por 3 dias. No [place] despures, porque puede causar sintomas peores.  [place] la crema Eucerin por el piel.  Toma agua. [place] tylenol o ibuprofen por el dolor. Regresa a la sala de emergencia si los sintomas empeoran.   Take antibiotics as prescribed.  Take the entire course, even if your symptoms improve. Use Flonase daily until your symptoms improve. Use Afrin for 3 days, do not use any longer, as this can cause worsening symptoms. You may use Eucerin cream for dry skin. Make sure you are staying well-hydrated with water. Use Tylenol or ibuprofen as needed for pain. Return to the emergency room if you develop high fevers, difficulty breathing, chest pain, or any new or concerning symptoms.

## 2018-05-22 NOTE — ED Provider Notes (Signed)
[facility] EMERGENCY DEPARTMENT Provider Note   CSN: [id number] Arrival date & time: 05/22/18  1826     History   Chief Complaint Chief Complaint  Patient presents with  . Sinus Pressure    HPI Alicia Wang is a 27 y.o. female presented for evaluation of nasal congestion and sinus pressure.  Patient states for the past month, she has had persistent nasal congestion sinus pressure.  She was having fever for the first 3 weeks.  She reports associated ear pain, eye pressure, and mild sore throat.  She states her throat hurts when she wakes in the morning due to postnasal drip and breathing through her mouth.  She has no medical problems, takes no medications daily.  She has tried nasal saline spray without improvement of her symptoms.  She used to have asthma and allergies, although states that she has not had the symptoms for several years.  She denies chest pain, difficulty breathing, nausea, vomiting, abdominal pain, urinary symptoms.  She smokes cigarettes, about a half a pack a day.    HPI  Past Medical History:  Diagnosis Date  . Anemia   . Tuberculosis     Patient Active Problem List   Diagnosis Date Noted  . Normal labor 03/13/2017  . Previous cesarean delivery affecting pregnancy, antepartum 09/23/2016  . Supervision of high risk pregnancy, antepartum 09/23/2016  . Cesarean delivery, without mention of indication, delivered, with or without mention of antepartum condition 11/21/2011  . Early/threatened labor 11/20/2011    Past Surgical History:  Procedure Laterality Date  . CESAREAN SECTION  11/20/2011   Procedure: CESAREAN SECTION;  Surgeon: [name], MD;  Location: [facility];  Service: Gynecology;  Laterality: N/A;  primary cesarean section of baby girl  at  2349  APGAR 9/9  . DILATION AND CURETTAGE OF UTERUS       OB History    Gravida  3   Para  2   Term  2   Preterm  0   AB  1   Living  2     SAB  1   TAB  0     Ectopic  0   Multiple  0   Live Births  2            Home Medications    Prior to Admission medications   Medication Sig Start Date End Date Taking? Authorizing Provider  acetaminophen (TYLENOL) 500 MG tablet Take 500-1,000 mg by mouth every 6 (six) hours as needed for mild pain, moderate pain, fever or headache.     [provider]  amoxicillin (AMOXIL) 500 MG capsule Take 1 capsule (500 mg total) by mouth 3 (three) times daily. 02/25/18   Gilda Crease, MD  amoxicillin-clavulanate (AUGMENTIN) 875-125 MG tablet Take 1 tablet by mouth every 12 (twelve) hours for 14 days. 05/22/18 06/05/18  Cheril Slattery, PA-C  benzonatate (TESSALON) 100 MG capsule Take 1 capsule (100 mg total) by mouth every 8 (eight) hours. 02/05/18   Janne Napoleon, NP  cyclobenzaprine (FLEXERIL) 10 MG tablet Take 1 tablet (10 mg total) by mouth 3 (three) times daily as needed for muscle spasms. 02/25/18   Gilda Crease, MD  famotidine (PEPCID) 40 MG tablet Take 1 tablet (40 mg total) by mouth daily. Patient taking differently: Take 40 mg by mouth daily as needed for heartburn or indigestion.  10/26/16   Armando Reichert, CNM  fluticasone (FLONASE) 50 MCG/ACT nasal spray Place 1 spray  into both nostrils 2 (two) times daily. 05/22/18   Lorianne Malbrough, PA-C  guaiFENesin-codeine (ROBITUSSIN AC) 100-10 MG/5ML syrup Take 5 mLs by mouth at bedtime as needed for cough. 02/05/18   Janne Napoleon, NP  ibuprofen (ADVIL,MOTRIN) 600 MG tablet Take 1 tablet (600 mg total) by mouth every 6 (six) hours. 03/14/17   Allie Bossier, MD  norethindrone (MICRONOR,CAMILA,ERRIN) 0.35 MG tablet Take 1 tablet (0.35 mg total) by mouth daily. 03/14/17   Allie Bossier, MD  oxymetazoline (AFRIN NASAL SPRAY) 0.05 % nasal spray Place 1 spray into both nostrils 2 (two) times daily for 3 days. 05/22/18 05/25/18  Jolissa Kapral, PA-C  Prenatal Vit-Fe Fumarate-FA (PRENATAL MULTIVITAMIN) TABS tablet Take 1 tablet by mouth daily at 12  noon. Patient taking differently: Take 1 tablet by mouth at bedtime.  03/15/14   Valarie Cones, Dema Severin, PA-C  pseudoephedrine (SUDAFED) 30 MG tablet Take 1 tablet (30 mg total) by mouth every 6 (six) hours as needed for congestion. 02/05/18   Janne Napoleon, NP  traMADol (ULTRAM) 50 MG tablet Take 1 tablet (50 mg total) by mouth every 6 (six) hours as needed. 02/25/18   Gilda Crease, MD    Family History No family history on file.  Social History Social History   Tobacco Use  . Smoking status: Current Every Day Smoker    Packs/day: 0.25    Years: 5.00    Pack years: 1.25    Types: Cigarettes  . Smokeless tobacco: Never Used  Substance Use Topics  . Alcohol use: No  . Drug use: No     Allergies   Latex   Review of Systems Review of Systems  Constitutional: Positive for fever (subjective, resolved).  HENT: Positive for congestion, ear pain, sinus pressure, sinus pain and sore throat. Negative for trouble swallowing and voice change.   Respiratory: Negative for shortness of breath.      Physical Exam Updated Vital Signs BP 110/70   Pulse 66   Temp 98.8 F (37.1 C) (Oral)   Resp 16   LMP 04/25/2018 (Exact Date)   SpO2 100%   Physical Exam  Constitutional: She is oriented to person, place, and time. She appears well-developed and well-nourished. No distress.  HENT:  Head: Normocephalic and atraumatic.  Right Ear: Tympanic membrane, external ear and ear canal normal.  Left Ear: Tympanic membrane, external ear and ear canal normal.  Nose: Mucosal edema present. Right sinus exhibits maxillary sinus tenderness and frontal sinus tenderness. Left sinus exhibits maxillary sinus tenderness and frontal sinus tenderness.  Mouth/Throat: Uvula is midline, oropharynx is clear and moist and mucous membranes are normal. No tonsillar exudate.  Nasal mucosal edema.  OP clear without tonsillar swelling or exudate.  Uvula midline with equal palate rise.  TMs nonerythematous and  nonbulging bilaterally.  Tenderness to palpation of maxillary and frontal sinuses.  Eyes: Pupils are equal, round, and reactive to light. Conjunctivae and EOM are normal.  Neck: Normal range of motion.  Cardiovascular: Normal rate, regular rhythm and intact distal pulses.  Pulmonary/Chest: Effort normal and breath sounds normal. No respiratory distress. She has no decreased breath sounds. She has no wheezes. She has no rhonchi. She has no rales.  Pt speaking in full sentences.  Clear lungs in all fields  Abdominal: Soft. She exhibits no distension. There is no tenderness.  Musculoskeletal: Normal range of motion.  Lymphadenopathy:    She has no cervical adenopathy.  Neurological: She is alert and oriented to person, place,  and time.  Skin: Skin is warm and dry.  Dry skin noted on hands and feet.  Psychiatric: She has a normal mood and affect.  Nursing note and vitals reviewed.    ED Treatments / Results  Labs (all labs ordered are listed, but only abnormal results are displayed) Labs Reviewed - No data to display  EKG None  Radiology No results found.  Procedures Procedures (including critical care time)  Medications Ordered in ED Medications - No data to display   Initial Impression / Assessment and Plan / ED Course  I have reviewed the triage vital signs and the nursing notes.  Pertinent labs & imaging results that were available during my care of the patient were reviewed by me and considered in my medical decision making (see chart for details).     Patient presenting with 1 month h/o sinusitis symptoms.  Physical exam reassuring, patient is afebrile and appears nontoxic.  Pulmonary exam reassuring.  Doubt pneumonia, strep, or peritonsillar abscess. As sxs have lasted 1 month, will give abx. Flonase and afrin for nasal congestion, discussed cessation of afrin after 3 days to prevent rebound congestion.  Recommended eucerin for dry skin. Patient to follow-up as needed.   At this time, patient appears safe for discharge.  Return precautions given.  Patient states she understands and agrees to plan.   Final Clinical Impressions(s) / ED Diagnoses   Final diagnoses:  Acute maxillary sinusitis, recurrence not specified  Dry skin    ED Discharge Orders        Ordered    amoxicillin-clavulanate (AUGMENTIN) 875-125 MG tablet  Every 12 hours     05/22/18 2059    fluticasone (FLONASE) 50 MCG/ACT nasal spray  2 times daily     05/22/18 2059    oxymetazoline (AFRIN NASAL SPRAY) 0.05 % nasal spray  2 times daily     05/22/18 2059       Purva Vessell, PA-C 05/22/18 2134    Mancel BaleWentz, Elliott, MD 05/30/18 [phone]

## 2018-10-16 ENCOUNTER — Encounter: Payer: Self-pay | Admitting: *Deleted

## 2019-01-04 ENCOUNTER — Encounter (HOSPITAL_COMMUNITY): Payer: Self-pay | Admitting: *Deleted

## 2019-01-04 ENCOUNTER — Inpatient Hospital Stay (HOSPITAL_COMMUNITY)
Admission: AD | Admit: 2019-01-04 | Discharge: 2019-01-04 | Disposition: A | Discharge status: Home / Self Care | Payer: Medicaid Other | Attending: Obstetrics & Gynecology | Admitting: Obstetrics & Gynecology

## 2019-01-04 DIAGNOSIS — F17210 Nicotine dependence, cigarettes, uncomplicated: Secondary | ICD-10-CM | POA: Insufficient documentation

## 2019-01-04 DIAGNOSIS — N760 Acute vaginitis: Secondary | ICD-10-CM | POA: Insufficient documentation

## 2019-01-04 DIAGNOSIS — R109 Unspecified abdominal pain: Secondary | ICD-10-CM | POA: Diagnosis present

## 2019-01-04 DIAGNOSIS — Z202 Contact with and (suspected) exposure to infections with a predominantly sexual mode of transmission: Secondary | ICD-10-CM

## 2019-01-04 DIAGNOSIS — B9689 Other specified bacterial agents as the cause of diseases classified elsewhere: Secondary | ICD-10-CM | POA: Diagnosis not present

## 2019-01-04 HISTORY — DX: Chlamydial infection, unspecified: A74.9

## 2019-01-04 LAB — URINALYSIS, ROUTINE W REFLEX MICROSCOPIC
BILIRUBIN URINE: NEGATIVE
Glucose, UA: NEGATIVE mg/dL
HGB URINE DIPSTICK: NEGATIVE
Ketones, ur: NEGATIVE mg/dL
Leukocytes, UA: NEGATIVE
Nitrite: NEGATIVE
PH: 5 (ref 5.0–8.0)
Protein, ur: NEGATIVE mg/dL
SPECIFIC GRAVITY, URINE: 1.025 (ref 1.005–1.030)

## 2019-01-04 LAB — WET PREP, GENITAL
Sperm: NONE SEEN
Trich, Wet Prep: NONE SEEN
YEAST WET PREP: NONE SEEN

## 2019-01-04 LAB — HEPATITIS B SURFACE ANTIGEN: Hepatitis B Surface Ag: NONREACTIVE

## 2019-01-04 LAB — POCT PREGNANCY, URINE: PREG TEST UR: NEGATIVE

## 2019-01-04 MED ORDER — METRONIDAZOLE 500 MG PO TABS: 500.0000 mg | ORAL_TABLET | Freq: Two times a day (BID) | ORAL | 0 refills | Status: DC

## 2019-01-04 NOTE — Discharge Instructions (Signed)

## 2019-01-04 NOTE — MAU Provider Note (Signed)
History     CSN: [id number]  Arrival date and time: 01/04/19 1058   First Provider Initiated Contact with Patient 01/04/19 1148      Chief Complaint  Patient presents with  . Abdominal Pain  . Vaginal Discharge   28 y.o. non-pregnant female presenting with vaginal discharge. Sx started 2 days ago. Discharge is thick, white, and malodorous. Reports her partner recently tested positive for Chlamydia. She is not using condoms. No other sexual partners. Using Depo Provera for contraception. Denies urinary sx. Also reports intermittent LAP x2 days. No fevers. No GI sx. Has not taken anything for the pain. Has remote hx of Chlamydia.      Past Medical History:  Diagnosis Date  . Anemia   . Chlamydia   . Tuberculosis     Past Surgical History:  Procedure Laterality Date  . BREAST ENHANCEMENT SURGERY    . CESAREAN SECTION  11/20/2011   Procedure: CESAREAN SECTION;  Surgeon: Roseanna RainbowLisa A Jackson-Moore, MD;  Location: [facility];  Service: Gynecology;  Laterality: N/A;  primary cesarean section of baby girl  at  2349  APGAR 9/9  . DILATION AND CURETTAGE OF UTERUS    . LIPOSUCTION      History reviewed. No pertinent family history.  Social History   Tobacco Use  . Smoking status: Current Every Day Smoker    Packs/day: 0.25    Years: 5.00    Pack years: 1.25    Types: Cigarettes  . Smokeless tobacco: Never Used  Substance Use Topics  . Alcohol use: Yes    Comment: occasional  . Drug use: Yes    Types: Marijuana    Comment: occasional    Allergies:  Allergies  Allergen Reactions  . Latex Itching and Rash    Medications Prior to Admission  Medication Sig Dispense Refill Last Dose  . acetaminophen (TYLENOL) 500 MG tablet Take 500-1,000 mg by mouth every 6 (six) hours as needed for mild pain, moderate pain, fever or headache.    Past Week at Unknown time  . amoxicillin (AMOXIL) 500 MG capsule Take 1 capsule (500 mg total) by mouth 3 (three) times daily. 30 capsule 0   .  benzonatate (TESSALON) 100 MG capsule Take 1 capsule (100 mg total) by mouth every 8 (eight) hours. 21 capsule 0   . cyclobenzaprine (FLEXERIL) 10 MG tablet Take 1 tablet (10 mg total) by mouth 3 (three) times daily as needed for muscle spasms. 20 tablet 0   . famotidine (PEPCID) 40 MG tablet Take 1 tablet (40 mg total) by mouth daily. (Patient taking differently: Take 40 mg by mouth daily as needed for heartburn or indigestion. ) 30 tablet 3 Past Week at Unknown time  . fluticasone (FLONASE) 50 MCG/ACT nasal spray Place 1 spray into both nostrils 2 (two) times daily. 16 g 0   . guaiFENesin-codeine (ROBITUSSIN AC) 100-10 MG/5ML syrup Take 5 mLs by mouth at bedtime as needed for cough. 60 mL 0   . ibuprofen (ADVIL,MOTRIN) 600 MG tablet Take 1 tablet (600 mg total) by mouth every 6 (six) hours. 30 tablet 0   . norethindrone (MICRONOR,CAMILA,ERRIN) 0.35 MG tablet Take 1 tablet (0.35 mg total) by mouth daily. 1 Package 11   . Prenatal Vit-Fe Fumarate-FA (PRENATAL MULTIVITAMIN) TABS tablet Take 1 tablet by mouth daily at 12 noon. (Patient taking differently: Take 1 tablet by mouth at bedtime. ) 30 tablet 12 Past Week at Unknown time  . pseudoephedrine (SUDAFED) 30 MG tablet Take 1 tablet (  30 mg total) by mouth every 6 (six) hours as needed for congestion. 30 tablet 0   . traMADol (ULTRAM) 50 MG tablet Take 1 tablet (50 mg total) by mouth every 6 (six) hours as needed. 15 tablet 0     Review of Systems  Constitutional: Negative for chills and fever.  Gastrointestinal: Positive for abdominal pain. Negative for constipation, diarrhea, nausea and vomiting.  Genitourinary: Negative for dysuria, frequency, urgency and vaginal bleeding.   Physical Exam   Blood pressure 121/69, pulse (!) 58, temperature 98 F (36.7 C), resp. rate 16, height 5\' 4"  (1.626 m), weight 69.4 kg, last menstrual period 01/16/2018, SpO2 98 %, unknown if currently breastfeeding.  Physical Exam  Nursing note and vitals  reviewed. Constitutional: She is oriented to person, place, and time. She appears well-developed and well-nourished. No distress.  HENT:  Head: Normocephalic and atraumatic.  Neck: Normal range of motion.  Respiratory: No respiratory distress.  GI: Soft. She exhibits no distension and no mass. There is abdominal tenderness in the suprapubic area. There is no rebound and no guarding.  Genitourinary:    Genitourinary Comments: External: no lesions or erythema Vagina: rugated, pink, moist, thin yellow malodorous discharge Uterus: non enlarged, retroverted, non tender, no CMT Adnexae: no masses, no tenderness left, no tenderness right Cervix normal    Musculoskeletal: Normal range of motion.  Neurological: She is alert and oriented to person, place, and time.  Skin: Skin is warm and dry.  Psychiatric: She has a normal mood and affect.   Results for orders placed or performed during the hospital encounter of 01/04/19 (from the past 24 hour(s))  Urinalysis, Routine w reflex microscopic     Status: None   Collection Time: 01/04/19 11:26 AM  Result Value Ref Range   Color, Urine YELLOW YELLOW   APPearance CLEAR CLEAR   Specific Gravity, Urine 1.025 1.005 - 1.030   pH 5.0 5.0 - 8.0   Glucose, UA NEGATIVE NEGATIVE mg/dL   Hgb urine dipstick NEGATIVE NEGATIVE   Bilirubin Urine NEGATIVE NEGATIVE   Ketones, ur NEGATIVE NEGATIVE mg/dL   Protein, ur NEGATIVE NEGATIVE mg/dL   Nitrite NEGATIVE NEGATIVE   Leukocytes, UA NEGATIVE NEGATIVE  Pregnancy, urine POC     Status: None   Collection Time: 01/04/19 11:29 AM  Result Value Ref Range   Preg Test, Ur NEGATIVE NEGATIVE  Wet prep, genital     Status: Abnormal   Collection Time: 01/04/19 12:17 PM  Result Value Ref Range   Yeast Wet Prep HPF POC NONE SEEN NONE SEEN   Trich, Wet Prep NONE SEEN NONE SEEN   Clue Cells Wet Prep HPF POC PRESENT (A) NONE SEEN   WBC, Wet Prep HPF POC MANY (A) NONE SEEN   Sperm NONE SEEN    MAU Course   Procedures Orders Placed This Encounter  Procedures  . Wet prep, genital    Standing Status:   Standing    Number of Occurrences:   1    Order Specific Question:   Patient immune status    Answer:   Normal  . Urinalysis, Routine w reflex microscopic    Standing Status:   Standing    Number of Occurrences:   1  . Hepatitis B surface antigen    Standing Status:   Standing    Number of Occurrences:   1  . RPR    Standing Status:   Standing    Number of Occurrences:   1  . HIV Antibody (  routine testing w rflx)    Standing Status:   Standing    Number of Occurrences:   1  . Hepatitis C antibody    Standing Status:   Standing    Number of Occurrences:   1  . Pregnancy, urine POC    Standing Status:   Standing    Number of Occurrences:   1  . Discharge patient    Order Specific Question:   Discharge disposition    Answer:   01-Home or Self Care [1]    Order Specific Question:   Discharge patient date    Answer:   01/04/2019   MDM Labs ordered and reviewed. No evidence of acute abdominal or pelvic process. Will treat BV. STD panel pending, will treat if positive. Stable for discharge.  Assessment and Plan   1. Bacterial vaginosis   2. STD exposure    Discharge home Follow up at [facility] prn No IC until BV treated Use condoms consistently  Allergies as of 01/04/2019      Reactions   Latex Itching, Rash      Medication List    STOP taking these medications   amoxicillin 500 MG capsule Commonly known as:  AMOXIL   benzonatate 100 MG capsule Commonly known as:  TESSALON   fluticasone 50 MCG/ACT nasal spray Commonly known as:  FLONASE   guaiFENesin-codeine 100-10 MG/5ML syrup Commonly known as:  ROBITUSSIN AC   norethindrone 0.35 MG tablet Commonly known as:  MICRONOR,CAMILA,ERRIN   pseudoephedrine 30 MG tablet Commonly known as:  SUDAFED     TAKE these medications   acetaminophen 500 MG tablet Commonly known as:  TYLENOL Take 500-1,000 mg by mouth every 6  (six) hours as needed for mild pain, moderate pain, fever or headache.   cyclobenzaprine 10 MG tablet Commonly known as:  FLEXERIL Take 1 tablet (10 mg total) by mouth 3 (three) times daily as needed for muscle spasms.   famotidine 40 MG tablet Commonly known as:  PEPCID Take 1 tablet (40 mg total) by mouth daily. What changed:    when to take this  reasons to take this   ibuprofen 600 MG tablet Commonly known as:  ADVIL,MOTRIN Take 1 tablet (600 mg total) by mouth every 6 (six) hours.   metroNIDAZOLE 500 MG tablet Commonly known as:  FLAGYL Take 1 tablet (500 mg total) by mouth 2 (two) times daily.   prenatal multivitamin Tabs tablet Take 1 tablet by mouth daily at 12 noon. What changed:  when to take this   traMADol 50 MG tablet Commonly known as:  ULTRAM Take 1 tablet (50 mg total) by mouth every 6 (six) hours as needed.      Donette Larry, CNM 01/04/2019, 12:28 PM

## 2019-01-04 NOTE — MAU Provider Note (Addendum)
History     CSN: [id number]  Arrival date and time: 01/04/19 1058   First Provider Initiated Contact with Patient 01/04/19 1148      Chief Complaint  Patient presents with  . Abdominal Pain  . Vaginal Discharge   HPI  Pt is a 28 y/o non-pregnant female presenting with a 2-day history of white, odorous vaginal discharge and suprapubic pain.  She states her partner was recently tested for Chlamydia with results pending. Pt has a history of Chlamydia 4 years ago. She is currently sexually active with one partner, is not using protection for STIs, but is on Depo for birth control. She denies nausea, vomiting, and painful urination.    Past Medical History:  Diagnosis Date  . Anemia   . Chlamydia   . Tuberculosis     Past Surgical History:  Procedure Laterality Date  . BREAST ENHANCEMENT SURGERY    . CESAREAN SECTION  11/20/2011   Procedure: CESAREAN SECTION;  Surgeon: [name], MD;  Location: [facility];  Service: Gynecology;  Laterality: N/A;  primary cesarean section of baby girl  at  2349  APGAR 9/9  . DILATION AND CURETTAGE OF UTERUS    . LIPOSUCTION      History reviewed. No pertinent family history.  Social History   Tobacco Use  . Smoking status: Current Every Day Smoker    Packs/day: 0.25    Years: 5.00    Pack years: 1.25    Types: Cigarettes  . Smokeless tobacco: Never Used  Substance Use Topics  . Alcohol use: Yes    Comment: occasional  . Drug use: Yes    Types: Marijuana    Comment: occasional    Allergies:  Allergies  Allergen Reactions  . Latex Itching and Rash    Medications Prior to Admission  Medication Sig Dispense Refill Last Dose  . acetaminophen (TYLENOL) 500 MG tablet Take 500-1,000 mg by mouth every 6 (six) hours as needed for mild pain, moderate pain, fever or headache.    Past Week at Unknown time  . amoxicillin (AMOXIL) 500 MG capsule Take 1 capsule (500 mg total) by mouth 3 (three) times daily. 30 capsule 0   . benzonatate  (TESSALON) 100 MG capsule Take 1 capsule (100 mg total) by mouth every 8 (eight) hours. 21 capsule 0   . cyclobenzaprine (FLEXERIL) 10 MG tablet Take 1 tablet (10 mg total) by mouth 3 (three) times daily as needed for muscle spasms. 20 tablet 0   . famotidine (PEPCID) 40 MG tablet Take 1 tablet (40 mg total) by mouth daily. (Patient taking differently: Take 40 mg by mouth daily as needed for heartburn or indigestion. ) 30 tablet 3 Past Week at Unknown time  . fluticasone (FLONASE) 50 MCG/ACT nasal spray Place 1 spray into both nostrils 2 (two) times daily. 16 g 0   . guaiFENesin-codeine (ROBITUSSIN AC) 100-10 MG/5ML syrup Take 5 mLs by mouth at bedtime as needed for cough. 60 mL 0   . ibuprofen (ADVIL,MOTRIN) 600 MG tablet Take 1 tablet (600 mg total) by mouth every 6 (six) hours. 30 tablet 0   . norethindrone (MICRONOR,CAMILA,ERRIN) 0.35 MG tablet Take 1 tablet (0.35 mg total) by mouth daily. 1 Package 11   . Prenatal Vit-Fe Fumarate-FA (PRENATAL MULTIVITAMIN) TABS tablet Take 1 tablet by mouth daily at 12 noon. (Patient taking differently: Take 1 tablet by mouth at bedtime. ) 30 tablet 12 Past Week at Unknown time  . pseudoephedrine (SUDAFED) 30 MG tablet  Take 1 tablet (30 mg total) by mouth every 6 (six) hours as needed for congestion. 30 tablet 0   . traMADol (ULTRAM) 50 MG tablet Take 1 tablet (50 mg total) by mouth every 6 (six) hours as needed. 15 tablet 0     Review of Systems  Constitutional: Negative for appetite change, chills and fever.  Respiratory: Negative for shortness of breath.   Gastrointestinal: Positive for abdominal pain (suprapubic). Negative for blood in stool, constipation, nausea and vomiting.  Genitourinary: Positive for vaginal discharge (white, odorous). Negative for difficulty urinating, dysuria and hematuria.  Neurological: Negative for headaches.   Physical Exam   Blood pressure 121/69, pulse (!) 58, temperature 98 F (36.7 C), resp. rate 16, height 5\' 4"  (1.626  m), weight 69.4 kg, last menstrual period 01/16/2018, SpO2 98 %, unknown if currently breastfeeding.  Physical Exam  Constitutional: She appears well-developed and well-nourished. No distress.  Cardiovascular: Normal rate and regular rhythm.  No murmur heard. Respiratory: Effort normal and breath sounds normal. She has no wheezes. She has no rales.  GI: Soft. There is abdominal tenderness (suprapubic. no RLQ pain). There is no guarding.  Genitourinary: Cervix exhibits discharge. Cervix exhibits no motion tenderness.    Vaginal discharge (yellowish, odorous) present.   Neurological: She is alert.  Skin: Skin is warm and dry.  Psychiatric: She has a normal mood and affect. Her behavior is normal.    MAU Course  Procedures --Speculum and bimanual exam  MDM --Wet prep, GC Chlamydia probe, Hep B surface antigen, HIV antibody, RPR: STI evaluation --POCT urine pregnancy test --Urinalysis: UTI evaluation --No evidence of acute abdominal condition  Assessment and Plan  --Bacterial vaginosis        -Metronidazole, 500mg  PO, 2x day for 7 days  --GC Chlamydia, Hep B, HIV, RPR         -Results pending  Isabel CapriceDeborah J Geanna Divirgilio 01/04/2019, 12:25 PM

## 2019-01-04 NOTE — MAU Note (Signed)
Pt reports lower abd pain for 3 days, partner positive chlamydia result today. Also reports a foul smelling discharge.

## 2019-01-05 LAB — HEPATITIS C ANTIBODY: HCV Ab: <0.1 s/co ratio (ref 0.0–0.9)

## 2019-01-05 LAB — GC/CHLAMYDIA PROBE AMP (~~LOC~~) NOT AT ARMC
CHLAMYDIA, DNA PROBE: POSITIVE — AB
NEISSERIA GONORRHEA: NEGATIVE

## 2019-01-05 LAB — RPR: RPR Ser Ql: NONREACTIVE

## 2019-01-05 LAB — HIV ANTIBODY (ROUTINE TESTING W REFLEX): HIV Screen 4th Generation wRfx: NONREACTIVE

## 2019-01-09 ENCOUNTER — Telehealth: Payer: Self-pay | Admitting: Certified Nurse Midwife

## 2019-01-09 NOTE — Telephone Encounter (Signed)
+  Chlamydia. Called pt but no answer and no voicemail.

## 2019-01-10 ENCOUNTER — Telehealth: Payer: Self-pay | Admitting: Student

## 2019-01-10 DIAGNOSIS — A749 Chlamydial infection, unspecified: Secondary | ICD-10-CM

## 2019-01-10 MED ORDER — AZITHROMYCIN 500 MG PO TABS: 1000.0000 mg | ORAL_TABLET | Freq: Once | ORAL | 0 refills | Status: AC

## 2019-01-10 NOTE — Telephone Encounter (Addendum)
Maddilyn Clarkston tested positive for  Chlamydia. Patient was called by RN and allergies and pharmacy confirmed. Rx sent to pharmacy of choice.   Judeth Horn, NP 01/10/2019 12:16 PM        ----- Message from Kathe Becton, RN sent at 01/10/2019 11:50 AM EST ----- This patient tested positive for:  Chlamydia  She is allergic to:"Latex", I have informed the patient of her results and confirmed her pharmacy is correct in her chart. Please send Rx.   Thank you,   Kathe Becton, RN   Results faxed to [facility] Department.

## 2019-04-19 ENCOUNTER — Encounter: Payer: Self-pay | Admitting: *Deleted

## 2019-07-30 ENCOUNTER — Other Ambulatory Visit: Payer: Self-pay | Admitting: Podiatry

## 2019-07-30 ENCOUNTER — Other Ambulatory Visit: Payer: Self-pay

## 2019-07-30 ENCOUNTER — Ambulatory Visit (INDEPENDENT_AMBULATORY_CARE_PROVIDER_SITE_OTHER): Payer: Medicaid Other

## 2019-07-30 ENCOUNTER — Encounter: Payer: Self-pay | Admitting: Podiatry

## 2019-07-30 ENCOUNTER — Ambulatory Visit: Payer: Medicaid Other | Admitting: Podiatry

## 2019-07-30 VITALS — BP 103/73 | HR 62 | Temp 97.1°F

## 2019-07-30 DIAGNOSIS — M778 Other enthesopathies, not elsewhere classified: Secondary | ICD-10-CM

## 2019-07-30 DIAGNOSIS — M2042 Other hammer toe(s) (acquired), left foot: Secondary | ICD-10-CM

## 2019-07-30 DIAGNOSIS — M2041 Other hammer toe(s) (acquired), right foot: Secondary | ICD-10-CM

## 2019-07-30 DIAGNOSIS — L84 Corns and callosities: Secondary | ICD-10-CM

## 2019-07-30 NOTE — Patient Instructions (Signed)
Pre-Operative Instructions  Congratulations, you have decided to take an important step towards improving your quality of life.  You can be assured that the doctors and staff at [facility] will be with you every step of the way.  Here are some important things you should know:  1. Plan to be at the surgery center/hospital at least 1 (one) hour prior to your scheduled time, unless otherwise directed by the surgical center/hospital staff.  You must have a responsible adult accompany you, remain during the surgery and drive you home.  Make sure you have directions to the surgical center/hospital to ensure you arrive on time. 2. If you are having surgery at [facility] or [facility], you will need a copy of your medical history and physical form from your family physician within one month prior to the date of surgery. We will give you a form for your primary physician to complete.  3. We make every effort to accommodate the date you request for surgery.  However, there are times where surgery dates or times have to be moved.  We will contact you as soon as possible if a change in schedule is required.   4. No aspirin/ibuprofen for one week before surgery.  If you are on aspirin, any non-steroidal anti-inflammatory medications (Mobic, Aleve, Ibuprofen) should not be taken seven (7) days prior to your surgery.  You make take Tylenol for pain prior to surgery.  5. Medications - If you are taking daily heart and blood pressure medications, seizure, reflux, allergy, asthma, anxiety, pain or diabetes medications, make sure you notify the surgery center/hospital before the day of surgery so they can tell you which medications you should take or avoid the day of surgery. 6. No food or drink after midnight the night before surgery unless directed otherwise by surgical center/hospital staff. 7. No alcoholic beverages 24-hours prior to surgery.  No smoking 24-hours prior or 24-hours after  surgery. 8. Wear loose pants or shorts. They should be loose enough to fit over bandages, boots, and casts. 9. Don't wear slip-on shoes. Sneakers are preferred. 10. Bring your boot with you to the surgery center/hospital.  Also bring crutches or a walker if your physician has prescribed it for you.  If you do not have this equipment, it will be provided for you after surgery. 11. If you have not been contacted by the surgery center/hospital by the day before your surgery, call to confirm the date and time of your surgery. 12. Leave-time from work may vary depending on the type of surgery you have.  Appropriate arrangements should be made prior to surgery with your employer. 13. Prescriptions will be provided immediately following surgery by your doctor.  Fill these as soon as possible after surgery and take the medication as directed. Pain medications will not be refilled on weekends and must be approved by the doctor. 14. Remove nail polish on the operative foot and avoid getting pedicures prior to surgery. 15. Wash the night before surgery.  The night before surgery wash the foot and leg well with water and the antibacterial soap provided. Be sure to pay special attention to beneath the toenails and in between the toes.  Wash for at least three (3) minutes. Rinse thoroughly with water and dry well with a towel.  Perform this wash unless told not to do so by your physician.  Enclosed: 1 Ice pack (please put in freezer the night before surgery)   1 Hibiclens skin cleaner     Pre-op instructions  If you have any questions regarding the instructions, please do not hesitate to call our office.  [place]: [address], [place], [place] [address] -- [phone]  [place]: [address] [place], [place] [address] -- [phone]  [place]: [address]  [place], [place] [address] -- [phone]  [place]: [address], [address], [place], [place] [address] -- [phone]  Website:  [email or web address] 

## 2019-08-01 NOTE — Progress Notes (Signed)
   HPI: 28 year old female presenting today as a new patient with a chief complaint of painful, hard skin between the 4th and 5th toes of the bilateral feet that has been present for the past few years. She has tried to shave the skin down herself which normally causes some mild bleeding. She states the skin always grows back after shaving it down. Patient is here for further evaluation and treatment.   Past Medical History:  Diagnosis Date  . Anemia   . Chlamydia   . Tuberculosis       Objective: Physical Exam General: The patient is alert and oriented x3 in no acute distress.  Dermatology: Hyperkeratotic tissue noted to the 4th interdigital webspace of the bilateral feet. Skin is cool, dry and supple bilateral lower extremities. Negative for open lesions or macerations.  Vascular: Palpable pedal pulses bilaterally. No edema or erythema noted. Capillary refill within normal limits.  Neurological: Epicritic and protective threshold grossly intact bilaterally.   Musculoskeletal Exam: All pedal and ankle joints range of motion within normal limits bilateral. Muscle strength 5/5 in all groups bilateral. Hammertoe contracture deformity noted to the 5th digit of the bilateral feet.  Radiographic Exam: Hammertoe contracture deformity noted to the interphalangeal joints and MPJ of the respective hammertoe digits mentioned on clinical musculoskeletal exam.     Assessment: 1. Hel[place] Molle bilateral 2. 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 Patient evaluated. X-Rays reviewed.  2. Today we discussed the conservative versus surgical management of the presenting pathology. The patient opts for surgical management. All possible complications and details of the procedure were explained. All patient questions were answered. No guarantees were expressed or implied. 3. Authorization for surgery was initiated today. Surgery will consist of PIPJ arthroplasty 5th digit bilaterally.  4.  Return to clinic one week post op.   [place] She is a Chief Operating Officer.     Edrick Kins, DPM [facility]  Dr. Edrick Kins, DPM    [address], [place] [address]                Office [phone]  Fax [phone]

## 2019-08-02 ENCOUNTER — Encounter: Payer: Self-pay | Admitting: Podiatry

## 2019-08-02 ENCOUNTER — Other Ambulatory Visit: Payer: Self-pay | Admitting: Podiatry

## 2019-08-02 DIAGNOSIS — M2041 Other hammer toe(s) (acquired), right foot: Secondary | ICD-10-CM | POA: Diagnosis not present

## 2019-08-02 DIAGNOSIS — M2042 Other hammer toe(s) (acquired), left foot: Secondary | ICD-10-CM

## 2019-08-02 MED ORDER — OXYCODONE-ACETAMINOPHEN 5-325 MG PO TABS: 1.0000 | ORAL_TABLET | Freq: Four times a day (QID) | ORAL | 0 refills | Status: DC | PRN

## 2019-08-02 NOTE — Progress Notes (Signed)
.  postop

## 2019-08-08 ENCOUNTER — Ambulatory Visit (INDEPENDENT_AMBULATORY_CARE_PROVIDER_SITE_OTHER): Payer: Self-pay | Admitting: Podiatry

## 2019-08-08 ENCOUNTER — Ambulatory Visit (INDEPENDENT_AMBULATORY_CARE_PROVIDER_SITE_OTHER): Payer: Medicaid Other

## 2019-08-08 ENCOUNTER — Other Ambulatory Visit: Payer: Self-pay

## 2019-08-08 DIAGNOSIS — M2041 Other hammer toe(s) (acquired), right foot: Secondary | ICD-10-CM

## 2019-08-08 DIAGNOSIS — Z09 Encounter for follow-up examination after completed treatment for conditions other than malignant neoplasm: Secondary | ICD-10-CM | POA: Diagnosis not present

## 2019-08-08 DIAGNOSIS — M2042 Other hammer toe(s) (acquired), left foot: Secondary | ICD-10-CM

## 2019-08-08 IMAGING — DX DG CHEST 2V
2 series · 2 of 2 positions shown · non-contrast
Comparison: None.

CLINICAL DATA: Fever.  Shortness of breath.

EXAM:
CHEST  2 VIEW

[w chest pa]
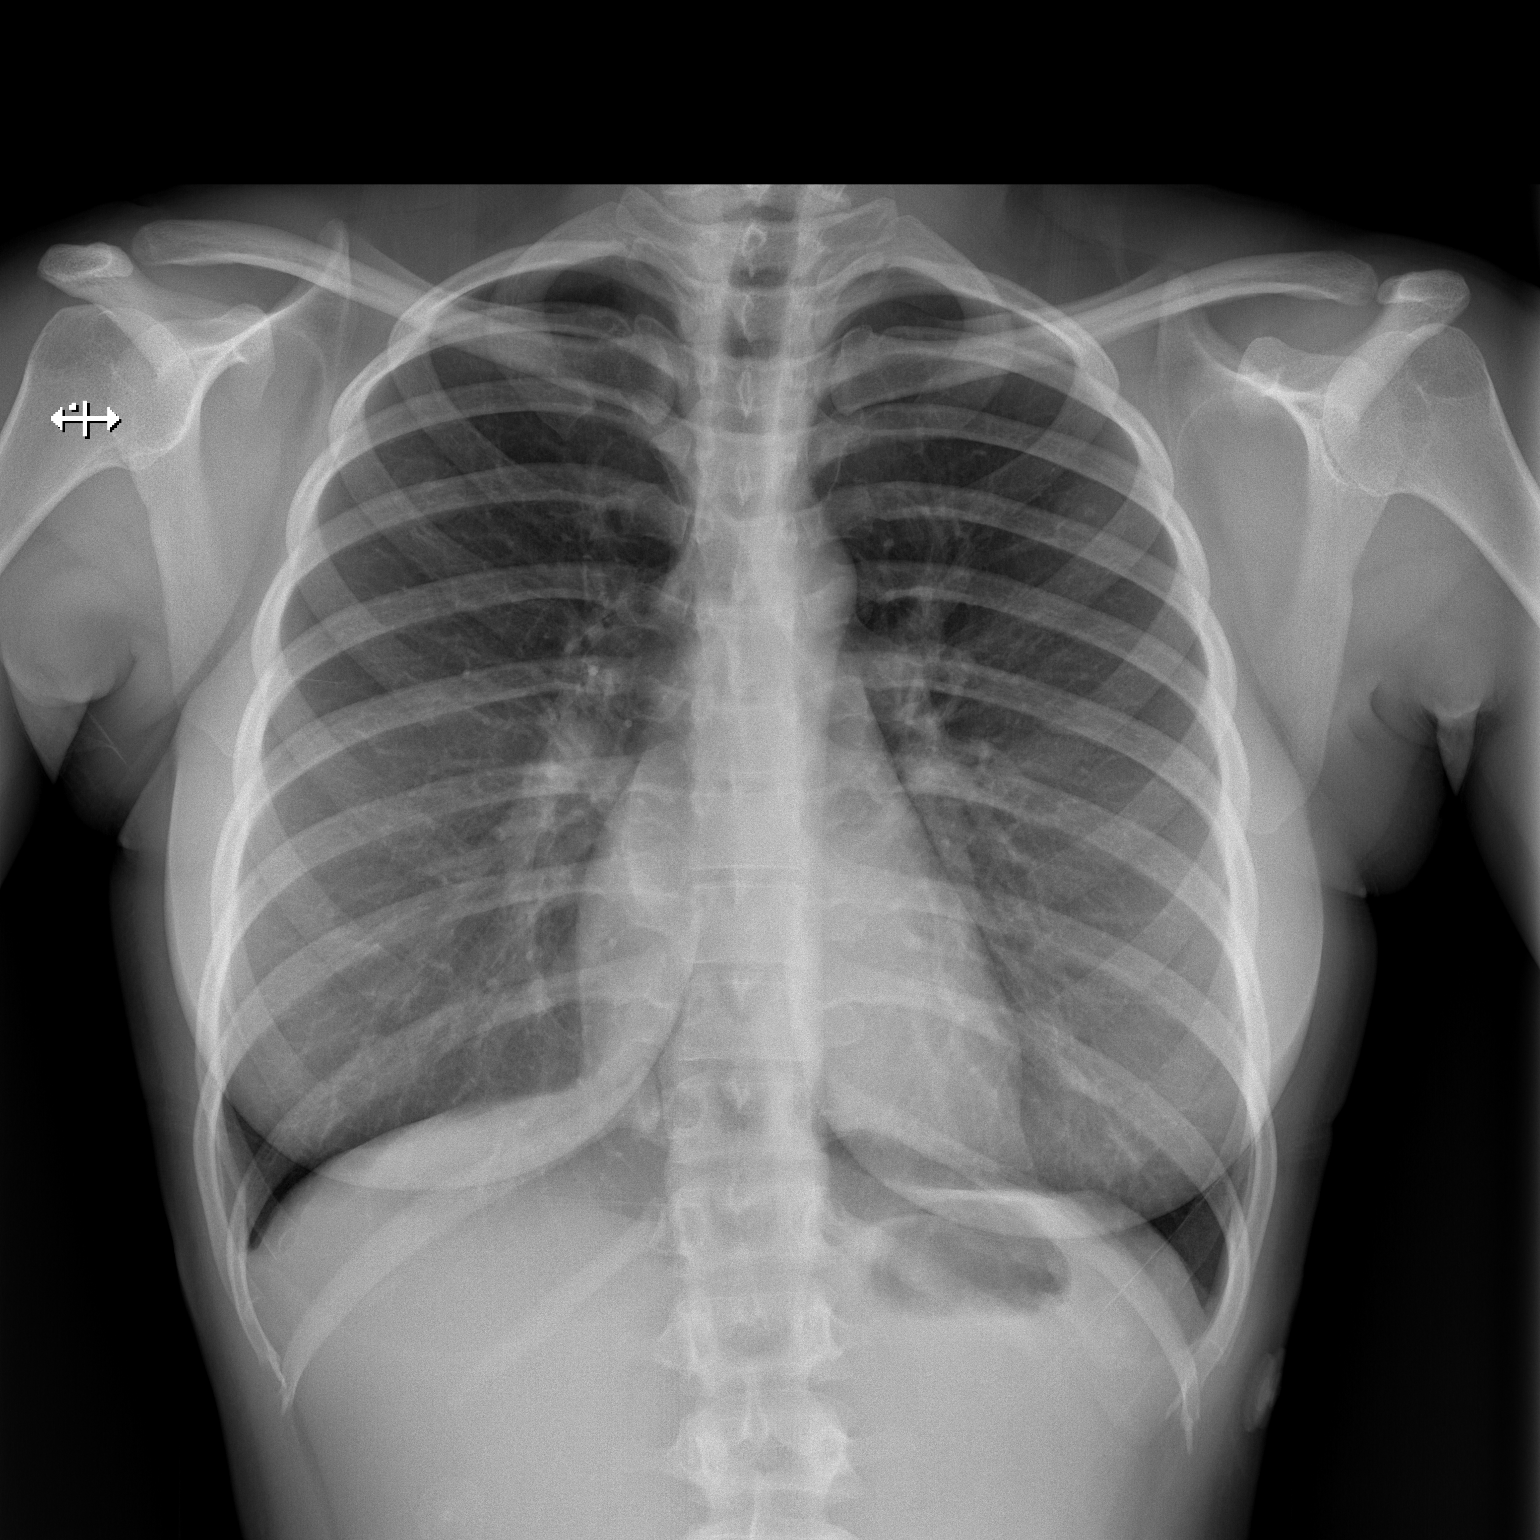

[w chest lat]
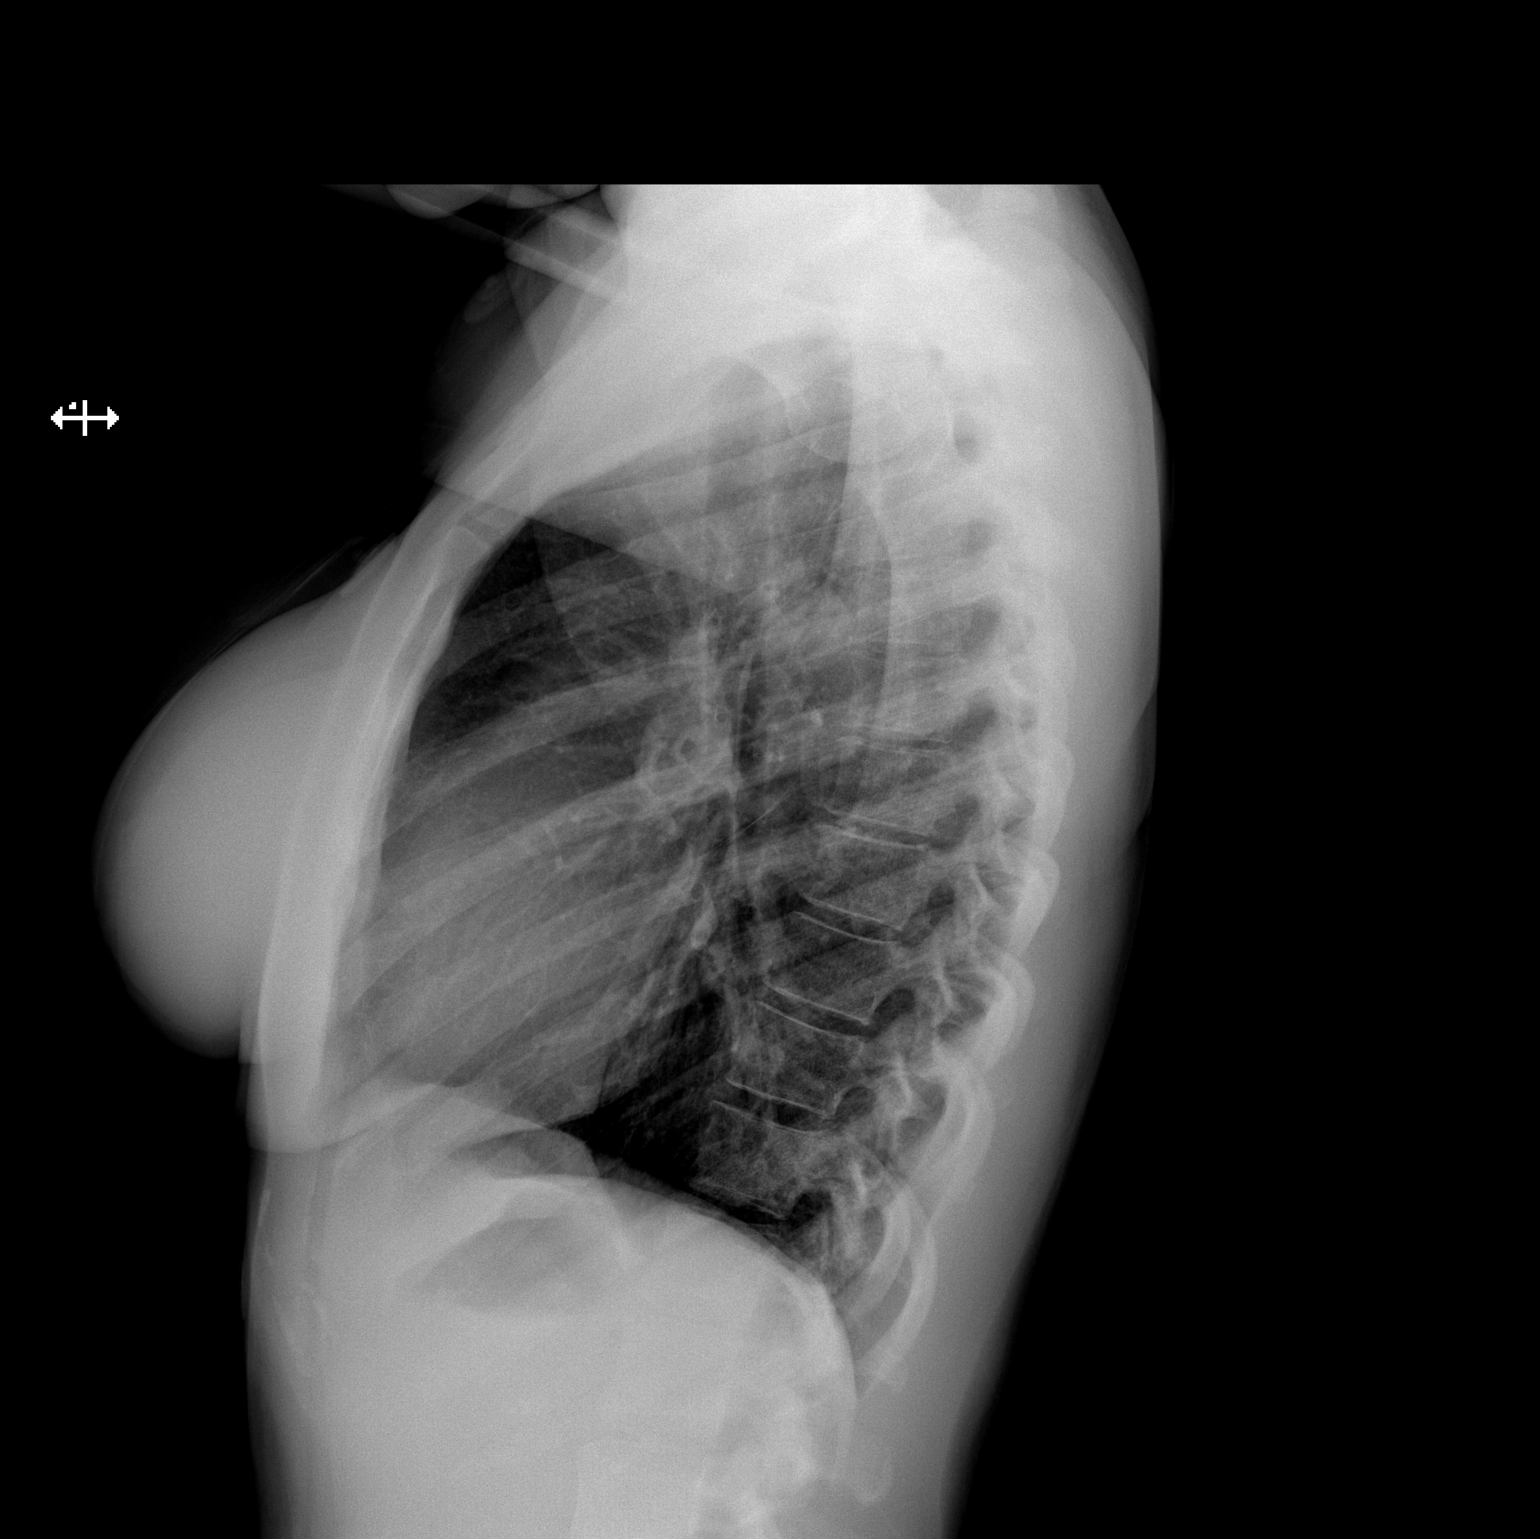

[2 of 2 positions shown; findings below may reference images not displayed]

FINDINGS: The heart size and mediastinal contours are within normal limits.
Both lungs are clear. The visualized skeletal structures are
unremarkable.
IMPRESSION: No active cardiopulmonary disease.

## 2019-08-11 NOTE — Progress Notes (Signed)
   Subjective:  Patient presents today status post bilateral 5th toe arthroplasty. DOS: 08/02/2019. She states she is doing well. She denies any significant pain or modifying factors. She has been using the post op shoes as directed. She denies any concerns or complaints at this time. Patient is here for further evaluation and treatment.    Past Medical History:  Diagnosis Date  . Anemia   . Chlamydia   . Tuberculosis       Objective/Physical Exam Neurovascular status intact.  Skin incisions appear to be well coapted with sutures and staples intact. No sign of infectious process noted. No dehiscence. No active bleeding noted. Moderate edema noted to the surgical extremity.  Radiographic Exam:  Orthopedic hardware and osteotomies sites appear to be stable with routine healing.  Assessment: 1. s/p bilateral 5th toe arthroplasty. DOS: 08/02/2019   Plan of Care:  1. Patient was evaluated. X-rays reviewed 2. Dressing changed.  3. Continue weightbearing in post op shoe.  4. Return to clinic in one week for suture removal.    Edrick Kins, DPM [facility]  Dr. Edrick Kins, [place]                                        [place], [place] [address]                Office [phone]  Fax [phone]

## 2019-08-15 ENCOUNTER — Other Ambulatory Visit: Payer: Self-pay

## 2019-08-15 ENCOUNTER — Ambulatory Visit (INDEPENDENT_AMBULATORY_CARE_PROVIDER_SITE_OTHER): Payer: Medicaid Other | Admitting: Podiatry

## 2019-08-15 DIAGNOSIS — Z09 Encounter for follow-up examination after completed treatment for conditions other than malignant neoplasm: Secondary | ICD-10-CM

## 2019-08-15 DIAGNOSIS — M2042 Other hammer toe(s) (acquired), left foot: Secondary | ICD-10-CM

## 2019-08-15 DIAGNOSIS — M2041 Other hammer toe(s) (acquired), right foot: Secondary | ICD-10-CM

## 2019-08-15 MED ORDER — CLOTRIMAZOLE-BETAMETHASONE 1-0.05 % EX CREA: 1.0000 "application " | TOPICAL_CREAM | Freq: Two times a day (BID) | CUTANEOUS | 3 refills | Status: DC

## 2019-08-18 NOTE — Progress Notes (Signed)
   Subjective:  Patient presents today status post bilateral 5th toe arthroplasty. DOS: 08/02/2019. She states she is doing well. She reports some intermittent soreness of the toes. She has been using the post op shoes as directed. She denies any modifying factors at this time. Patient is here for further evaluation and treatment.    Past Medical History:  Diagnosis Date  . Anemia   . Chlamydia   . Tuberculosis       Objective/Physical Exam Neurovascular status intact.  Skin incisions appear to be well coapted with sutures and staples intact. No sign of infectious process noted. No dehiscence. No active bleeding noted. Moderate edema noted to the surgical extremity.  Assessment: 1. s/p bilateral 5th toe arthroplasty. DOS: 08/02/2019   Plan of Care:  1. Patient was evaluated.  2. Sutures removed.  3. Continue using post op shoes bilaterally.  4. Return to clinic in 4 weeks.    Edrick Kins, DPM [facility]  Dr. Edrick Kins, [place]                                        [place], [place] [address]                Office [phone]  Fax [phone]

## 2019-09-12 ENCOUNTER — Encounter: Payer: Medicaid Other | Admitting: Podiatry

## 2019-09-26 ENCOUNTER — Other Ambulatory Visit: Payer: Self-pay

## 2019-09-26 ENCOUNTER — Ambulatory Visit (INDEPENDENT_AMBULATORY_CARE_PROVIDER_SITE_OTHER): Payer: Medicaid Other

## 2019-09-26 ENCOUNTER — Ambulatory Visit (INDEPENDENT_AMBULATORY_CARE_PROVIDER_SITE_OTHER): Payer: Self-pay | Admitting: Podiatry

## 2019-09-26 ENCOUNTER — Ambulatory Visit: Payer: Medicaid Other

## 2019-09-26 DIAGNOSIS — M2042 Other hammer toe(s) (acquired), left foot: Secondary | ICD-10-CM

## 2019-09-26 DIAGNOSIS — M2041 Other hammer toe(s) (acquired), right foot: Secondary | ICD-10-CM | POA: Diagnosis not present

## 2019-09-26 DIAGNOSIS — Z09 Encounter for follow-up examination after completed treatment for conditions other than malignant neoplasm: Secondary | ICD-10-CM

## 2019-09-30 NOTE — Progress Notes (Signed)
   Subjective:  Patient presents today status post bilateral 5th toe arthroplasty. DOS: 08/02/2019. She states she is doing well. She denies any pain or modifying factors. She has been using the post op shoes as directed. Patient is here for further evaluation and treatment.    Past Medical History:  Diagnosis Date  . Anemia   . Chlamydia   . Tuberculosis       Objective/Physical Exam Neurovascular status intact.  Skin incisions appear to be well coapted. No sign of infectious process noted. No dehiscence. No active bleeding noted. Moderate edema noted to the surgical extremity.  Radiographic Exam:  Arthroplasty noted to the 5th digits bilaterally. Normal osseous mineralization. Joint spaces preserved. No fracture/dislocation/boney destruction.     Assessment: 1. s/p bilateral 5th toe arthroplasty. DOS: 08/02/2019   Plan of Care:  1. Patient was evaluated. X-Rays reviewed.  2. May resume full activity with no restrictions.  3. Recommended good shoe gear.  4. Return to clinic as needed.     Edrick Kins, DPM [facility]  Dr. Edrick Kins, [place]                                        [place], [place] [address]                Office [phone]  Fax [phone]

## 2020-03-04 ENCOUNTER — Encounter (HOSPITAL_COMMUNITY): Payer: Self-pay | Admitting: Family Medicine

## 2020-03-04 ENCOUNTER — Ambulatory Visit (HOSPITAL_COMMUNITY)
Admission: EM | Admit: 2020-03-04 | Discharge: 2020-03-04 | Disposition: A | Discharge status: Home / Self Care | Payer: Medicaid Other | Attending: Family Medicine | Admitting: Family Medicine

## 2020-03-04 ENCOUNTER — Other Ambulatory Visit: Payer: Self-pay

## 2020-03-04 DIAGNOSIS — Z8619 Personal history of other infectious and parasitic diseases: Secondary | ICD-10-CM

## 2020-03-04 DIAGNOSIS — N739 Female pelvic inflammatory disease, unspecified: Secondary | ICD-10-CM | POA: Diagnosis not present

## 2020-03-04 DIAGNOSIS — N941 Unspecified dyspareunia: Secondary | ICD-10-CM | POA: Diagnosis not present

## 2020-03-04 DIAGNOSIS — N898 Other specified noninflammatory disorders of vagina: Secondary | ICD-10-CM | POA: Diagnosis not present

## 2020-03-04 MED ORDER — LIDOCAINE HCL 2 % IJ SOLN
INTRAMUSCULAR | Status: AC
  Filled 2020-03-04: qty 20

## 2020-03-04 MED ORDER — CEFTRIAXONE SODIUM 500 MG IJ SOLR
500.0000 mg | Freq: Once | INTRAMUSCULAR | Status: AC
  Administered 2020-03-04: 500 mg via INTRAMUSCULAR

## 2020-03-04 MED ORDER — DOXYCYCLINE HYCLATE 100 MG PO CAPS: 100.0000 mg | ORAL_CAPSULE | Freq: Two times a day (BID) | ORAL | 0 refills | Status: AC

## 2020-03-04 MED ORDER — METRONIDAZOLE 500 MG PO TABS: 500.0000 mg | ORAL_TABLET | Freq: Two times a day (BID) | ORAL | 0 refills | Status: AC

## 2020-03-04 NOTE — ED Provider Notes (Signed)
[facility]    CSN: [id number] Arrival date & time: 03/04/20  1155      History   Chief Complaint Chief Complaint  Patient presents with  . vaginal discharge    HPI Alicia Wang is a 29 y.o. female.   Patient is a 29 year old female with past medical history of anemia, chlamydia, tuberculosis, bacterial vaginosis.  Reporting approximately 5 days of abnormal discharge and pelvic pain.  Symptoms been constant and worsening.  She is having dyspareunia and noticed some blood after intercourse.  Had intercourse with a new boyfriend approximate 2 weeks ago unprotected.  Last menstrual period was the 22nd through the 26th February.  Took a pregnancy test 2 days ago that was negative.  Denies any itching, irritation in the vaginal area.  Denies any specific odor.  No fever, chills, body aches or night sweats.  ROS per HPI      Past Medical History:  Diagnosis Date  . Anemia   . Chlamydia   . Tuberculosis     Patient Active Problem List   Diagnosis Date Noted  . Normal labor 03/13/2017  . Previous cesarean delivery affecting pregnancy, antepartum 09/23/2016  . Supervision of high risk pregnancy, antepartum 09/23/2016  . Cesarean delivery, without mention of indication, delivered, with or without mention of antepartum condition 11/21/2011  . Early/threatened labor 11/20/2011    Past Surgical History:  Procedure Laterality Date  . BREAST ENHANCEMENT SURGERY    . CESAREAN SECTION  11/20/2011   Procedure: CESAREAN SECTION;  Surgeon: [name], MD;  Location: [facility];  Service: Gynecology;  Laterality: N/A;  primary cesarean section of baby girl  at  2349  APGAR 9/9  . DILATION AND CURETTAGE OF UTERUS    . LIPOSUCTION      OB History    Gravida  3   Para  2   Term  2   Preterm  0   AB  1   Living  2     SAB  1   TAB  0   Ectopic  0   Multiple  0   Live Births  2            Home Medications    Prior to Admission  medications   Medication Sig Start Date End Date Taking? Authorizing Provider  doxycycline (VIBRAMYCIN) 100 MG capsule Take 1 capsule (100 mg total) by mouth 2 (two) times daily for 10 days. 03/04/20 03/14/20  Dahlia Byes A, NP  metroNIDAZOLE (FLAGYL) 500 MG tablet Take 1 tablet (500 mg total) by mouth 2 (two) times daily. 03/04/20   Dahlia Byes A, NP  famotidine (PEPCID) 40 MG tablet Take 1 tablet (40 mg total) by mouth daily. 10/26/16 03/04/20  Armando Reichert, CNM    Family History History reviewed. No pertinent family history.  Social History Social History   Tobacco Use  . Smoking status: Former Smoker    Packs/day: 0.25    Years: 5.00    Pack years: 1.25    Types: Cigarettes  . Smokeless tobacco: Never Used  Substance Use Topics  . Alcohol use: Yes    Comment: occasional  . Drug use: Yes    Types: Marijuana    Comment: occasional     Allergies   Latex   Review of Systems Review of Systems   Physical Exam Triage Vital Signs ED Triage Vitals  Enc Vitals Group     BP 03/04/20 1231 119/62     Pulse  Rate 03/04/20 1231 60     Resp 03/04/20 1231 18     Temp 03/04/20 1231 99.1 F (37.3 C)     Temp Source 03/04/20 1231 Oral     SpO2 03/04/20 1231 100 %     Weight 03/04/20 1232 150 lb (68 kg)     Height 03/04/20 1232 5\' 4"  (1.626 m)     Head Circumference --      Peak Flow --      Pain Score 03/04/20 1232 5     Pain Loc --      Pain Edu? --      Excl. in GC? --    No data found.  Updated Vital Signs BP 119/62 (BP Location: Left Arm)   Pulse 60   Temp 99.1 F (37.3 C) (Oral)   Resp 18   Ht 5\' 4"  (1.626 m)   Wt 150 lb (68 kg)   SpO2 100%   BMI 25.75 kg/m   Visual Acuity Right Eye Distance:   Left Eye Distance:   Bilateral Distance:    Right Eye Near:   Left Eye Near:    Bilateral Near:     Physical Exam Vitals and nursing note reviewed.  Constitutional:      General: She is not in acute distress.    Appearance: Normal appearance. She is not  ill-appearing, toxic-appearing or diaphoretic.     Comments: Tearful    HENT:     Head: Normocephalic.     Nose: Nose normal.     Mouth/Throat:     Pharynx: Oropharynx is clear.  Eyes:     Conjunctiva/sclera: Conjunctivae normal.  Pulmonary:     Effort: Pulmonary effort is normal.  Abdominal:     Palpations: Abdomen is soft.     Tenderness: There is abdominal tenderness in the left lower quadrant. There is no right CVA tenderness or left CVA tenderness.       Comments: TTP  Genitourinary:    Comments: External vaginal exam normal without swelling, lesions.   Internal vaginal exam- Thick white discharge noted in vaginal vault.  Thick, white discharge from cervix with cervical friability. Mild cervical motion tenderness. No specific odor.  Musculoskeletal:        General: Normal range of motion.     Cervical back: Normal range of motion.  Skin:    General: Skin is warm and dry.  Neurological:     Mental Status: She is alert.  Psychiatric:        Mood and Affect: Mood normal.      UC Treatments / Results  Labs (all labs ordered are listed, but only abnormal results are displayed) Labs Reviewed - No data to display  EKG   Radiology No results found.  Procedures Procedures (including critical care time)  Medications Ordered in UC Medications  cefTRIAXone (ROCEPHIN) injection 500 mg (has no administration in time range)    Initial Impression / Assessment and Plan / UC Course  I have reviewed the triage vital signs and the nursing notes.  Pertinent labs & imaging results that were available during my care of the patient were reviewed by me and considered in my medical decision making (see chart for details).     PID-most likely diagnosis based on exam and symptoms.  History of chlamydia. Treating for gonorrhea, chlamydia and bacterial vaginosis. Treating with doxycycline, metronidazole and Rocephin injection here in clinic. Recommended 600 of ibuprofen every  8 hours as needed for pain Recommend no  sexual activity until the infection has cleared and partner get treated if positive results Follow up as needed for continued or worsening symptoms Contact given for OB/GYN follow-up Final Clinical Impressions(s) / UC Diagnoses   Final diagnoses:  PID (pelvic inflammatory disease)     Discharge Instructions     Treating you for gonorrhea, chlamydia and bacterial vaginosis. Also treating you for pelvic infection Take the medication as prescribed.  You can do ibuprofen 600 mg every 8 hours as needed for pain Avoid sexual activity We will call you with any positive results of your swab    ED Prescriptions    Medication Sig Dispense Auth. Provider   doxycycline (VIBRAMYCIN) 100 MG capsule Take 1 capsule (100 mg total) by mouth 2 (two) times daily for 10 days. 20 capsule Kharon Hixon A, NP   metroNIDAZOLE (FLAGYL) 500 MG tablet Take 1 tablet (500 mg total) by mouth 2 (two) times daily. 14 tablet Glenda Kunst A, NP     PDMP not reviewed this encounter.   Orvan July, NP 03/04/20 1327

## 2020-03-04 NOTE — Discharge Instructions (Signed)
Treating you for gonorrhea, chlamydia and bacterial vaginosis. Also treating you for pelvic infection Take the medication as prescribed.  You can do ibuprofen 600 mg every 8 hours as needed for pain Avoid sexual activity We will call you with any positive results of your swab

## 2020-03-04 NOTE — ED Triage Notes (Signed)
Pt had intercourse with boyfriend two weeks ago and urinated afterwards and had blood in urine. Pt states white discharge started 5 days ago. Pt states the odor smells like a potato. Pt states pelvic painx5days.

## 2020-03-06 ENCOUNTER — Other Ambulatory Visit: Payer: Self-pay

## 2020-03-06 ENCOUNTER — Ambulatory Visit
Admission: EM | Admit: 2020-03-06 | Discharge: 2020-03-06 | Disposition: A | Discharge status: Home / Self Care | Payer: Medicaid Other | Source: Home / Self Care

## 2020-03-06 ENCOUNTER — Ambulatory Visit
Admission: RE | Admit: 2020-03-06 | Discharge: 2020-03-06 | Disposition: A | Discharge status: Home / Self Care | Payer: Medicaid Other | Source: Ambulatory Visit

## 2020-03-06 DIAGNOSIS — Z712 Person consulting for explanation of examination or test findings: Secondary | ICD-10-CM

## 2020-03-06 LAB — CERVICOVAGINAL ANCILLARY ONLY
Bacterial vaginitis: NEGATIVE
Candida vaginitis: POSITIVE — AB
Chlamydia: NEGATIVE
Neisseria Gonorrhea: NEGATIVE
Trichomonas: NEGATIVE

## 2020-03-06 NOTE — Discharge Instructions (Signed)
Keep taking the medication that was prescribed. Make sure you take with food. Follow up as needed for continued or worsening symptoms

## 2020-03-08 ENCOUNTER — Telehealth (HOSPITAL_COMMUNITY): Payer: Self-pay | Admitting: *Deleted

## 2020-03-08 MED ORDER — FLUCONAZOLE 150 MG PO TABS: 150.0000 mg | ORAL_TABLET | Freq: Every day | ORAL | 0 refills | Status: AC

## 2020-03-08 NOTE — Telephone Encounter (Signed)
Test for candida (yeast) was positive.  Prescription for fluconazole 150mg po now, repeat dose in 3d if needed, #2 no refills, sent to the pharmacy of record.  Recheck or followup with PCP for further evaluation if symptoms are not improving.   

## 2020-05-20 ENCOUNTER — Ambulatory Visit (HOSPITAL_COMMUNITY)
Admission: EM | Admit: 2020-05-20 | Discharge: 2020-05-20 | Disposition: A | Discharge status: Home / Self Care | Payer: Medicaid Other | Attending: Family Medicine | Admitting: Family Medicine

## 2020-05-20 ENCOUNTER — Encounter (HOSPITAL_COMMUNITY): Payer: Self-pay

## 2020-05-20 ENCOUNTER — Other Ambulatory Visit: Payer: Self-pay

## 2020-05-20 DIAGNOSIS — R52 Pain, unspecified: Secondary | ICD-10-CM

## 2020-05-20 DIAGNOSIS — Z87891 Personal history of nicotine dependence: Secondary | ICD-10-CM | POA: Insufficient documentation

## 2020-05-20 DIAGNOSIS — U071 COVID-19: Secondary | ICD-10-CM | POA: Diagnosis not present

## 2020-05-20 DIAGNOSIS — R6883 Chills (without fever): Secondary | ICD-10-CM | POA: Diagnosis not present

## 2020-05-20 DIAGNOSIS — R519 Headache, unspecified: Secondary | ICD-10-CM | POA: Diagnosis present

## 2020-05-20 MED ORDER — DICLOFENAC SODIUM 75 MG PO TBEC: 75.0000 mg | DELAYED_RELEASE_TABLET | Freq: Two times a day (BID) | ORAL | 0 refills | Status: AC

## 2020-05-20 NOTE — ED Triage Notes (Signed)
Pt presents top UC for COVID testing. Pt reports headache x 1 week; chills, body aches x 4 days.

## 2020-05-20 NOTE — ED Provider Notes (Signed)
[facility]   [id number] 05/20/20 Arrival Time: 3716  ASSESSMENT & PLAN:  1. Acute nonintractable headache, unspecified headache type   2. Chills   3. Generalized body aches      COVID-19 testing sent. See letter/work note on file for self-isolation guidelines.  Meds ordered this encounter  Medications  . diclofenac (VOLTAREN) 75 MG EC tablet    Sig: Take 1 tablet (75 mg total) by mouth 2 (two) times daily.    Dispense:  14 tablet    Refill:  0   Fluids/rest.  Follow-up Information    [facility].   Specialty: Urgent Care Why: As needed. Contact information: [place] [phone]          Reviewed expectations re: course of current medical issues. Questions answered. Outlined signs and symptoms indicating need for more acute intervention. Understanding verbalized. After Visit Summary given.   SUBJECTIVE: History from: patient. Alicia Wang is a 29 y.o. female who requests COVID-19 testing. Known COVID-19 contact: none. Recent travel: none. Reports: a headache, chills, and generalized body aches for the past 5-7 days; gradual onset. Denies: fever and difficulty breathing. Normal PO intake without n/v/d. Also reports decreased sense of taste/smell.    OBJECTIVE:  Vitals:   05/20/20 1353  BP: 123/75  Pulse: 72  Resp: 16  Temp: 98.4 F (36.9 C)  TempSrc: Oral  SpO2: 100%    General appearance: alert; no distress; appears fatigued Eyes: PERRLA; EOMI; conjunctiva normal HENT: [place]; AT; nasal congestion Neck: supple  Lungs: speaks full sentences without difficulty; unlabored Extremities: no edema Skin: warm and dry Neurologic: normal gait Psychological: alert and cooperative; normal mood and affect  Labs:  Labs Reviewed  SARS CORONAVIRUS 2 (TAT 6-24 HRS)    Allergies  Allergen Reactions  . Latex Itching and Rash    Past Medical History:  Diagnosis  Date  . Anemia   . Chlamydia   . Tuberculosis    Social History   Socioeconomic History  . Marital status: Single    Spouse name: Not on file  . Number of children: Not on file  . Years of education: Not on file  . Highest education level: Not on file  Occupational History  . Not on file  Tobacco Use  . Smoking status: Former Smoker    Packs/day: 0.25    Years: 5.00    Pack years: 1.25    Types: Cigarettes  . Smokeless tobacco: Never Used  Substance and Sexual Activity  . Alcohol use: Yes    Comment: occasional  . Drug use: Yes    Types: Marijuana    Comment: occasional  . Sexual activity: Yes  Other Topics Concern  . Not on file  Social History Narrative  . Not on file   Social Determinants of Health   Financial Resource Strain:   . Difficulty of Paying Living Expenses:   Food Insecurity:   . Worried About Charity fundraiser in the Last Year:   . Arboriculturist in the Last Year:   Transportation Needs:   . Film/video editor (Medical):   Marland Kitchen Lack of Transportation (Non-Medical):   Physical Activity:   . Days of Exercise per Week:   . Minutes of Exercise per Session:   Stress:   . Feeling of Stress :   Social Connections:   . Frequency of Communication with Friends and Family:   . Frequency of Social  Gatherings with Friends and Family:   . Attends Religious Services:   . Active Member of Clubs or Organizations:   . Attends Banker Meetings:   Marland Kitchen Marital Status:   Intimate Partner Violence:   . Fear of Current or Ex-Partner:   . Emotionally Abused:   Marland Kitchen Physically Abused:   . Sexually Abused:    History reviewed. No pertinent family history. Past Surgical History:  Procedure Laterality Date  . BREAST ENHANCEMENT SURGERY    . CESAREAN SECTION  11/20/2011   Procedure: CESAREAN SECTION;  Surgeon: [name], MD;  Location: [facility];  Service: Gynecology;  Laterality: N/A;  primary cesarean section of baby girl  at  2349  APGAR 9/9    . DILATION AND CURETTAGE OF UTERUS    . LIPOSUCTION       Mardella Layman, MD 05/20/20 [phone]

## 2020-05-20 NOTE — Discharge Instructions (Addendum)
You have been tested for COVID-19 today. °If your test returns positive, you will receive a phone call from [facility] regarding your results. °Negative test results are not called. °Both positive and negative results area always visible on MyChart. °If you do not have a MyChart account, sign up instructions are provided in your discharge papers. °Please do not hesitate to contact us should you have questions or concerns. ° °

## 2020-05-21 LAB — SARS CORONAVIRUS 2 (TAT 6-24 HRS): SARS Coronavirus 2: POSITIVE — AB

## 2020-05-22 ENCOUNTER — Telehealth: Payer: Self-pay | Admitting: Physician Assistant

## 2020-05-22 ENCOUNTER — Telehealth: Payer: Self-pay | Admitting: Adult Health

## 2020-05-22 NOTE — Telephone Encounter (Signed)
Called to discuss with patient about Covid symptoms and the use of bamlanivimab/etesevimab or casirivimab/imdevimab, a monoclonal antibody infusion for those with mild to moderate Covid symptoms and at a high risk of hospitalization.  Pt is qualified for this infusion at the [facility] infusion center due to BMI >25   Called but could not leave a message. I did send a [employer].   Cline Crock PA-C  [facility]

## 2020-05-22 NOTE — Telephone Encounter (Signed)
Attempted x 2 to call patient, call was connected and then disconnected.
